# Patient Record
Sex: Female | Born: 1975 | Race: White | Hispanic: No | Marital: Married | Smoking: Never smoker
Health system: Southern US, Community
[De-identification: ages and names within clinical notes are randomized; demographics above are authoritative.]

## PROBLEM LIST (undated history)

## (undated) DIAGNOSIS — N200 Calculus of kidney: Secondary | ICD-10-CM

## (undated) DIAGNOSIS — G4730 Sleep apnea, unspecified: Secondary | ICD-10-CM

## (undated) DIAGNOSIS — E119 Type 2 diabetes mellitus without complications: Secondary | ICD-10-CM

## (undated) DIAGNOSIS — K589 Irritable bowel syndrome without diarrhea: Secondary | ICD-10-CM

## (undated) DIAGNOSIS — I10 Essential (primary) hypertension: Secondary | ICD-10-CM

## (undated) DIAGNOSIS — E785 Hyperlipidemia, unspecified: Secondary | ICD-10-CM

## (undated) DIAGNOSIS — Z8719 Personal history of other diseases of the digestive system: Secondary | ICD-10-CM

## (undated) DIAGNOSIS — R011 Cardiac murmur, unspecified: Secondary | ICD-10-CM

## (undated) HISTORY — DX: Cardiac murmur, unspecified: R01.1

## (undated) HISTORY — PX: SPHINCTEROTOMY: SHX5279

## (undated) HISTORY — PX: COLONOSCOPY: SHX174

## (undated) HISTORY — DX: Hyperlipidemia, unspecified: E78.5

## (undated) HISTORY — DX: Calculus of kidney: N20.0

---

## 2009-07-10 ENCOUNTER — Other Ambulatory Visit: Admission: RE | Admit: 2009-07-10 | Discharge: 2009-07-10 | Payer: Self-pay | Admitting: Family Medicine

## 2010-10-20 ENCOUNTER — Other Ambulatory Visit
Admission: RE | Admit: 2010-10-20 | Discharge: 2010-10-20 | Payer: Self-pay | Source: Home / Self Care | Admitting: Family Medicine

## 2013-05-29 ENCOUNTER — Other Ambulatory Visit (HOSPITAL_COMMUNITY)
Admission: RE | Admit: 2013-05-29 | Discharge: 2013-05-29 | Disposition: A | Discharge status: Home / Self Care | Payer: Managed Care, Other (non HMO) | Source: Ambulatory Visit | Attending: Family Medicine | Admitting: Family Medicine

## 2013-05-29 DIAGNOSIS — Z124 Encounter for screening for malignant neoplasm of cervix: Secondary | ICD-10-CM | POA: Insufficient documentation

## 2013-05-29 DIAGNOSIS — Z1151 Encounter for screening for human papillomavirus (HPV): Secondary | ICD-10-CM | POA: Insufficient documentation

## 2015-01-10 ENCOUNTER — Encounter (HOSPITAL_COMMUNITY): Payer: Self-pay | Admitting: Emergency Medicine

## 2015-01-10 ENCOUNTER — Emergency Department (HOSPITAL_COMMUNITY)
Admission: EM | Admit: 2015-01-10 | Discharge: 2015-01-11 | Discharge status: Against Medical Advice | Payer: 59 | Attending: Emergency Medicine | Admitting: Emergency Medicine

## 2015-01-10 DIAGNOSIS — R112 Nausea with vomiting, unspecified: Secondary | ICD-10-CM | POA: Diagnosis not present

## 2015-01-10 DIAGNOSIS — R197 Diarrhea, unspecified: Secondary | ICD-10-CM | POA: Diagnosis not present

## 2015-01-10 HISTORY — DX: Irritable bowel syndrome, unspecified: K58.9

## 2015-01-10 NOTE — ED Notes (Signed)
Pt reports onset of N/V/D starting this morning. Pt reports hx of IBS but states this does not feel like an IBS flare up. Pt unable to come out of lobby restroom out of fear of going to bathroom on herself.

## 2015-01-11 NOTE — ED Notes (Signed)
No answer in WR

## 2015-01-15 ENCOUNTER — Encounter (HOSPITAL_COMMUNITY): Payer: Self-pay | Admitting: *Deleted

## 2015-01-15 ENCOUNTER — Emergency Department (HOSPITAL_COMMUNITY)
Admission: EM | Admit: 2015-01-15 | Discharge: 2015-01-15 | Disposition: A | Discharge status: Home / Self Care | Payer: 59 | Attending: Emergency Medicine | Admitting: Emergency Medicine

## 2015-01-15 DIAGNOSIS — R112 Nausea with vomiting, unspecified: Secondary | ICD-10-CM

## 2015-01-15 DIAGNOSIS — Z79899 Other long term (current) drug therapy: Secondary | ICD-10-CM | POA: Diagnosis not present

## 2015-01-15 DIAGNOSIS — E119 Type 2 diabetes mellitus without complications: Secondary | ICD-10-CM | POA: Diagnosis not present

## 2015-01-15 DIAGNOSIS — I10 Essential (primary) hypertension: Secondary | ICD-10-CM | POA: Diagnosis not present

## 2015-01-15 DIAGNOSIS — R000 Tachycardia, unspecified: Secondary | ICD-10-CM | POA: Diagnosis not present

## 2015-01-15 DIAGNOSIS — Z3202 Encounter for pregnancy test, result negative: Secondary | ICD-10-CM | POA: Insufficient documentation

## 2015-01-15 DIAGNOSIS — Z880 Allergy status to penicillin: Secondary | ICD-10-CM | POA: Diagnosis not present

## 2015-01-15 DIAGNOSIS — R197 Diarrhea, unspecified: Secondary | ICD-10-CM | POA: Insufficient documentation

## 2015-01-15 DIAGNOSIS — Z7982 Long term (current) use of aspirin: Secondary | ICD-10-CM | POA: Diagnosis not present

## 2015-01-15 DIAGNOSIS — Z8719 Personal history of other diseases of the digestive system: Secondary | ICD-10-CM | POA: Diagnosis not present

## 2015-01-15 HISTORY — DX: Type 2 diabetes mellitus without complications: E11.9

## 2015-01-15 HISTORY — DX: Essential (primary) hypertension: I10

## 2015-01-15 LAB — CBC
HCT: 45.8 % (ref 36.0–46.0)
Hemoglobin: 15.8 g/dL — ABNORMAL HIGH (ref 12.0–15.0)
MCH: 30 pg (ref 26.0–34.0)
MCHC: 34.5 g/dL (ref 30.0–36.0)
MCV: 86.9 fL (ref 78.0–100.0)
Platelets: 363 10*3/uL (ref 150–400)
RBC: 5.27 MIL/uL — AB (ref 3.87–5.11)
RDW: 13.1 % (ref 11.5–15.5)
WBC: 10.1 10*3/uL (ref 4.0–10.5)

## 2015-01-15 LAB — BASIC METABOLIC PANEL
Anion gap: 11 (ref 5–15)
BUN: 13 mg/dL (ref 6–23)
CALCIUM: 8.4 mg/dL (ref 8.4–10.5)
CO2: 22 mmol/L (ref 19–32)
Chloride: 109 mmol/L (ref 96–112)
Creatinine, Ser: 0.64 mg/dL (ref 0.50–1.10)
GFR calc non Af Amer: >90 mL/min (ref >90)
Glucose, Bld: 123 mg/dL — ABNORMAL HIGH (ref 70–99)
Potassium: 3.8 mmol/L (ref 3.5–5.1)
SODIUM: 142 mmol/L (ref 135–145)

## 2015-01-15 LAB — POC URINE PREG, ED: PREG TEST UR: NEGATIVE

## 2015-01-15 LAB — CBG MONITORING, ED: Glucose-Capillary: 143 mg/dL — ABNORMAL HIGH (ref 70–99)

## 2015-01-15 MED ORDER — SODIUM CHLORIDE 0.9 % IV BOLUS (SEPSIS)
1000.0000 mL | Freq: Once | INTRAVENOUS | Status: AC
  Administered 2015-01-15: 1000 mL via INTRAVENOUS

## 2015-01-15 MED ORDER — ONDANSETRON HCL 4 MG/2ML IJ SOLN
4.0000 mg | Freq: Once | INTRAMUSCULAR | Status: AC
  Administered 2015-01-15: 4 mg via INTRAVENOUS
  Filled 2015-01-15: qty 2

## 2015-01-15 MED ORDER — ONDANSETRON HCL 4 MG PO TABS: 4.0000 mg | ORAL_TABLET | Freq: Four times a day (QID) | ORAL | Status: DC

## 2015-01-15 NOTE — ED Notes (Signed)
Patient denies "having any ability or need to use restroom at this time"

## 2015-01-15 NOTE — ED Provider Notes (Signed)
CSN: [id number]     Arrival date & time 01/15/15  1122 History   First MD Initiated Contact with Patient 01/15/15 1132     Chief Complaint  Patient presents with  . Tachycardia  . Emesis  . Diarrhea     (Consider location/radiation/quality/duration/timing/severity/associated sxs/prior Treatment) HPI [name] is a 39 y.o white female with a history of diabetes presents with N/V/D since 7pm last night.  She said she had the same symptoms on Friday when she started a new medication for her diabetes called Victoza.  She said she took Victoza last night and felt the same way but worse with more than 5 episodes of vomiting. Nothing makes it better or worse. She has no pain now.  She denies any dizziness, abdominal pain, chest pain, shortness of breath or swelling in the legs.  Past Medical History  Diagnosis Date  . IBS (irritable bowel syndrome)   . Diabetes   . Hypertension    No past surgical history on file. No family history on file. History  Substance Use Topics  . Smoking status: Never Smoker   . Smokeless tobacco: Not on file  . Alcohol Use: Yes     Comment: rare   OB History    No data available     Review of Systems  Constitutional: Negative for fever and chills.  Respiratory: Negative for cough and shortness of breath.   Gastrointestinal: Negative for abdominal distention.  Genitourinary: Negative for dysuria, frequency, hematuria and difficulty urinating.  All other systems reviewed and are negative.     Allergies  Metformin and related; Penicillins; and Prilosec  Home Medications   Prior to Admission medications   Medication Sig Start Date End Date Taking? Authorizing Provider  aspirin 81 MG tablet Take 81 mg by mouth daily.   Yes Historical Provider, MD  atenolol (TENORMIN) 100 MG tablet Take 50 mg by mouth 2 (two) times daily. 12/19/14  Yes Historical Provider, MD  Calcium-Vitamin D-Vitamin K (CHEWABLE CALCIUM PO) Take 1 tablet by mouth daily.   Yes  Historical Provider, MD  ENSKYCE 0.15-30 MG-MCG tablet Take 1 tablet by mouth daily. 10/16/14  Yes Historical Provider, MD  glipiZIDE (GLUCOTROL) 10 MG tablet Take 1 tablet by mouth 2 (two) times daily. 10/16/14  Yes Historical Provider, MD  hyoscyamine (LEVSIN, ANASPAZ) 0.125 MG tablet Take 1 tablet by mouth as needed. 12/19/14  Yes Historical Provider, MD  lisinopril (PRINIVIL,ZESTRIL) 10 MG tablet Take 1 tablet by mouth daily. 10/31/14  Yes Historical Provider, MD  Multiple Vitamins-Minerals (MULTIVITAMIN ADULT PO) Take 1 tablet by mouth daily.   Yes Historical Provider, MD  ONGLYZA 5 MG TABS tablet Take 1 tablet by mouth at bedtime. 12/19/14  Yes Historical Provider, MD  simvastatin (ZOCOR) 20 MG tablet Take 10 mg by mouth at bedtime. 10/31/14  Yes Historical Provider, MD  venlafaxine XR (EFFEXOR-XR) 150 MG 24 hr capsule Take 1 capsule by mouth daily. 10/31/14  Yes Historical Provider, MD  VICTOZA 18 MG/3ML SOPN Inject 1.2 mLs into the skin daily. 12/27/14  Yes Historical Provider, MD  Wheat Dextrin (BENEFIBER DRINK MIX PO) Take 15 mLs by mouth daily.   Yes Historical Provider, MD  ondansetron (ZOFRAN) 4 MG tablet Take 1 tablet (4 mg total) by mouth every 6 (six) hours. 01/15/15   Weltha Cathy Patel-Mills, PA-C   BP 141/78 mmHg  Pulse 95  Temp(Src) 99 F (37.2 C) (Oral)  Resp 15  SpO2 100% Physical Exam  Constitutional: She is oriented to person,  place, and time. She appears well-developed and well-nourished.  HENT:  Head: Normocephalic and atraumatic.  Mouth/Throat: Uvula is midline, oropharynx is clear and moist and mucous membranes are normal.  Eyes: Conjunctivae are normal.  Neck: Normal range of motion. Neck supple.  Cardiovascular: Regular rhythm and normal heart sounds.  Tachycardia present.   Pulmonary/Chest: Effort normal and breath sounds normal.  Abdominal: Soft.  Non tender.  Musculoskeletal: Normal range of motion.  Neurological: She is alert and oriented to person, place, and  time.  Skin: Skin is warm and dry.  Nursing note and vitals reviewed.   ED Course  Procedures (including critical care time) Labs Review Labs Reviewed  CBC - Abnormal; Notable for the following:    RBC 5.27 (*)    Hemoglobin 15.8 (*)    All other components within normal limits  CBG MONITORING, ED - Abnormal; Notable for the following:    Glucose-Capillary 143 (*)    All other components within normal limits  BASIC METABOLIC PANEL  CBG MONITORING, ED  POC URINE PREG, ED    Imaging Review No results found.   EKG Interpretation   Date/Time:  Wednesday January 15 2015 11:43:50 EDT Ventricular Rate:  115 PR Interval:  141 QRS Duration: 80 QT Interval:  332 QTC Calculation: 459 R Axis:   12 Text Interpretation:  Sinus tachycardia Baseline wander in lead(s) II III  aVR aVF V1 V2 V4 V5 V6 No old tracing to compare Confirmed by GOLDSTON   MD, SCOTT ([id number]) on 01/15/2015 1:51:03 PM      MDM   Final diagnoses:  Nausea vomiting and diarrhea   Patient is here for N/V/D but no abdominal pain. She has a history of IBS.  She has no pain complaints.  She is unable to hold down fluids since yesterday night.  She is tachycardic in the 110's on exam. Afebrile. Her CBG is normal.  I have started her on fluids and zofran.    14:15 Patient is feeling much better and has less nausea.  The nurse has tried to get blood several times with unsuccessful attempts.  I will give her another bag of fluids and zofran.   15:30 I am still waiting on a BMP, she is a difficult blood draw.  Her heart rate has improved and is now in the 90's. CBC is normal.  She is unable to give a urine sample after 2L's of fluids.  I will give her another liter.   She is going to see her primary care provider tomorrow. I advised her not to take the Victoza until she follows up with her PCP tomorrow. She agreed with the plan.  Her CBG is 143.   I have explained the plan of care to and signed out this patient to Will  Dansie, PA-C.     Ottie Glazier, PA-C 01/15/15 1651  Sherwood Gambler, MD 01/16/15 [phone]

## 2015-01-15 NOTE — ED Notes (Signed)
Main lab called for recollect of BMET.

## 2015-01-15 NOTE — ED Notes (Signed)
Unsuccessful attempt at lab collection from main lab.

## 2015-01-15 NOTE — ED Notes (Signed)
PA at bedside.

## 2015-01-15 NOTE — ED Notes (Signed)
Pt able to urinate. Urine dark in color. Pt denies urinary symptoms.

## 2015-01-15 NOTE — ED Notes (Signed)
Main lab phlebotomy at bedside

## 2015-01-15 NOTE — ED Notes (Signed)
Pt unable to urinate at this time. Water provided

## 2015-01-15 NOTE — ED Notes (Signed)
Pt aware of the need for a urine sample. 

## 2015-01-15 NOTE — ED Notes (Signed)
Pt alert,oriented, and ambulatory upon DC. 

## 2015-01-15 NOTE — ED Notes (Signed)
Recollect attempt unsuccessful.  RN notified.  Will make another attempt after pt has received more fluids.

## 2015-01-15 NOTE — ED Notes (Signed)
PA will at bedside

## 2015-01-15 NOTE — ED Provider Notes (Signed)
This patient's care was assumed from [facility], PA-C at shift change. Please see her note for further information. Annette Khan is a 39 y.o white female with a history of diabetes presents with N/V/D since 7pm last night.  Patient feeling much better and is no longer tachycardic. She has tolerated juice, water and crackers in the emergency department now. She reports feeling better and is ready to be discharged. We were awaiting basic metabolic panel and urine pregnancy test prior to discharge. Plan is to discharge with return of labs. At my evaluation the patient reports feeling much better. She is tolerated juice, water and crackers in the emergency department prior to discharge. She is no longer tachycardic. Her basic metabolic panel in unremarkable. She is a negative urine pregnancy test. We'll discharge a prescription for Zofran. I advised the patient to follow-up with their primary care provider this week. She tells me she has an appointment with her primary care provider in 2 days. I advised her to keep this appointment. I advised the patient to return to the emergency department with new or worsening symptoms or new concerns. The patient verbalized understanding and agreement with plan.   Results for orders placed or performed during the hospital encounter of 01/15/15  CBC  (at [facility])  Result Value Ref Range   WBC 10.1 4.0 - 10.5 K/uL   RBC 5.27 (H) 3.87 - 5.11 MIL/uL   Hemoglobin 15.8 (H) 12.0 - 15.0 g/dL   HCT 45.8 36.0 - 46.0 %   MCV 86.9 78.0 - 100.0 fL   MCH 30.0 26.0 - 34.0 pg   MCHC 34.5 30.0 - 36.0 g/dL   RDW 13.1 11.5 - 15.5 %   Platelets 363 150 - 400 K/uL  Basic metabolic panel  Result Value Ref Range   Sodium 142 135 - 145 mmol/L   Potassium 3.8 3.5 - 5.1 mmol/L   Chloride 109 96 - 112 mmol/L   CO2 22 19 - 32 mmol/L   Glucose, Bld 123 (H) 70 - 99 mg/dL   BUN 13 6 - 23 mg/dL   Creatinine, Ser 0.64 0.50 - 1.10 mg/dL   Calcium 8.4 8.4 - 10.5 mg/dL   GFR  calc non Af Amer >90 >90 mL/min   GFR calc Af Amer >90 >90 mL/min   Anion gap 11 5 - 15  CBG monitoring, ED  Result Value Ref Range   Glucose-Capillary 143 (H) 70 - 99 mg/dL  POC Urine Preg, ED (if patient is pre-menopausal female) NOT at [facility]  Result Value Ref Range   Preg Test, Ur NEGATIVE NEGATIVE   No results found.      Waynetta Pean, PA-C 01/15/15 1842  Sherwood Gambler, MD 01/16/15 [phone]

## 2015-01-15 NOTE — Discharge Instructions (Signed)

## 2015-01-15 NOTE — ED Notes (Signed)
Light green tube collected by Acey Lav via [place]

## 2015-01-15 NOTE — ED Notes (Signed)
Pt reports hx of diabetes and IBS. Pcp started pt on victoza med for diabetes 2 weeks ago. Pt had nausea/vomiting/diarrhea since last night. Hx HTN, has not taken BP meds. Denies pain.

## 2015-01-30 ENCOUNTER — Other Ambulatory Visit: Payer: Self-pay | Admitting: Family Medicine

## 2015-01-30 ENCOUNTER — Ambulatory Visit
Admission: RE | Admit: 2015-01-30 | Discharge: 2015-01-30 | Disposition: A | Discharge status: Home / Self Care | Payer: 59 | Source: Ambulatory Visit | Attending: Family Medicine | Admitting: Family Medicine

## 2015-01-30 DIAGNOSIS — M25572 Pain in left ankle and joints of left foot: Secondary | ICD-10-CM

## 2015-10-10 ENCOUNTER — Other Ambulatory Visit: Payer: Self-pay | Admitting: Family Medicine

## 2015-10-10 DIAGNOSIS — N63 Unspecified lump in unspecified breast: Secondary | ICD-10-CM

## 2015-10-21 ENCOUNTER — Ambulatory Visit
Admission: RE | Admit: 2015-10-21 | Discharge: 2015-10-21 | Disposition: A | Discharge status: Home / Self Care | Payer: 59 | Source: Ambulatory Visit | Attending: Family Medicine | Admitting: Family Medicine

## 2015-10-21 DIAGNOSIS — N63 Unspecified lump in unspecified breast: Secondary | ICD-10-CM

## 2016-01-07 IMAGING — CR DG ANKLE COMPLETE 3+V*L*
3 series · 3 of 3 positions shown · non-contrast
Comparison: None.

CLINICAL DATA: Twisted left ankle with pain and swelling laterally

EXAM:
LEFT ANKLE COMPLETE - 3+ VIEW

[view not recorded (1 of 3)]
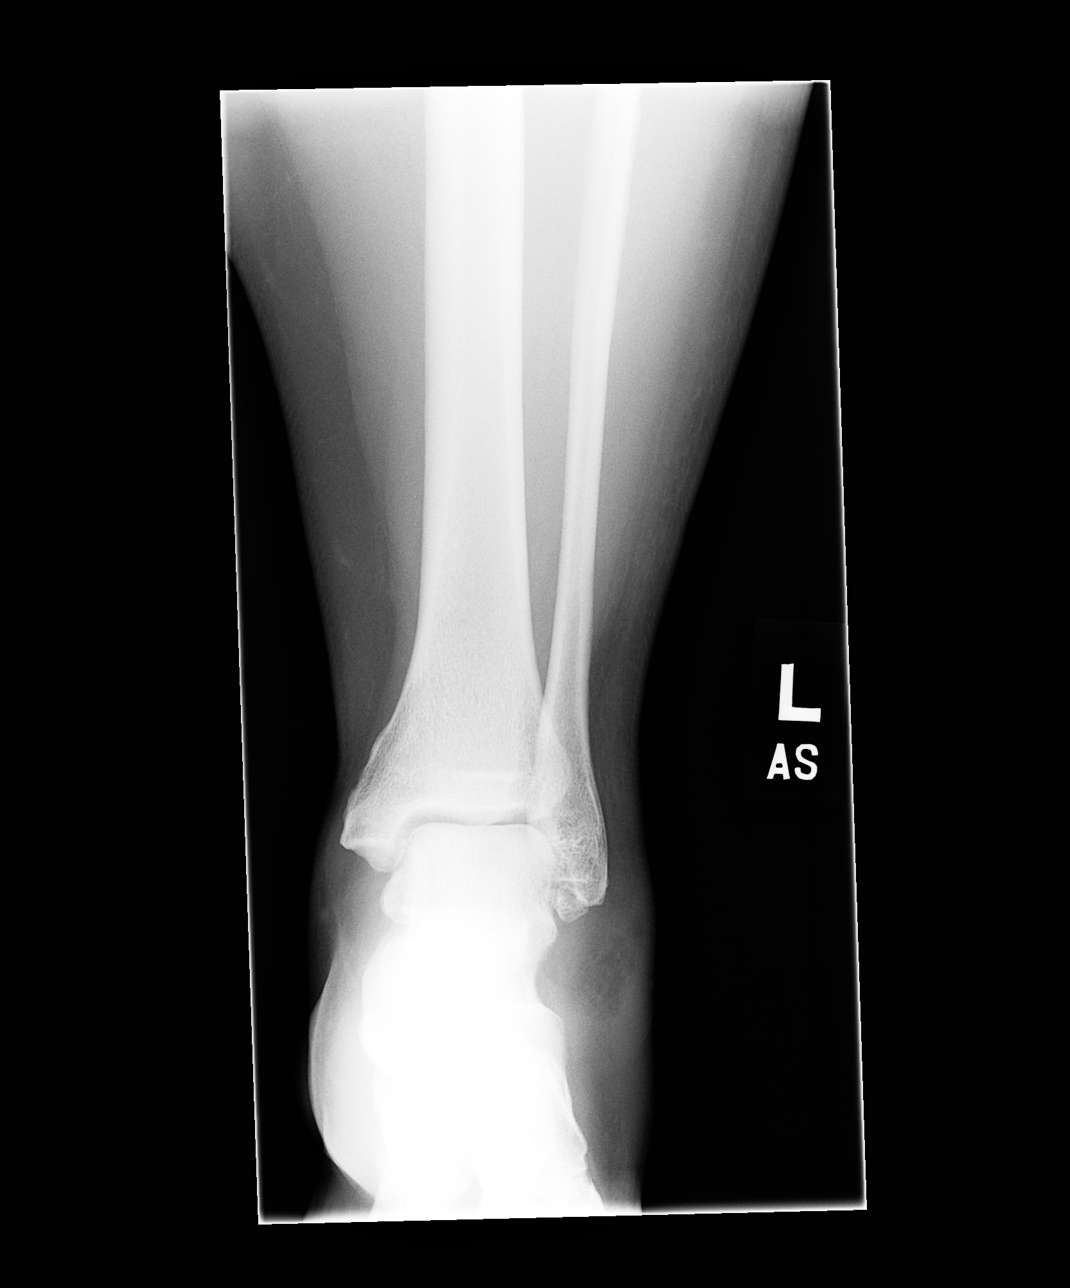

[view not recorded (2 of 3)]
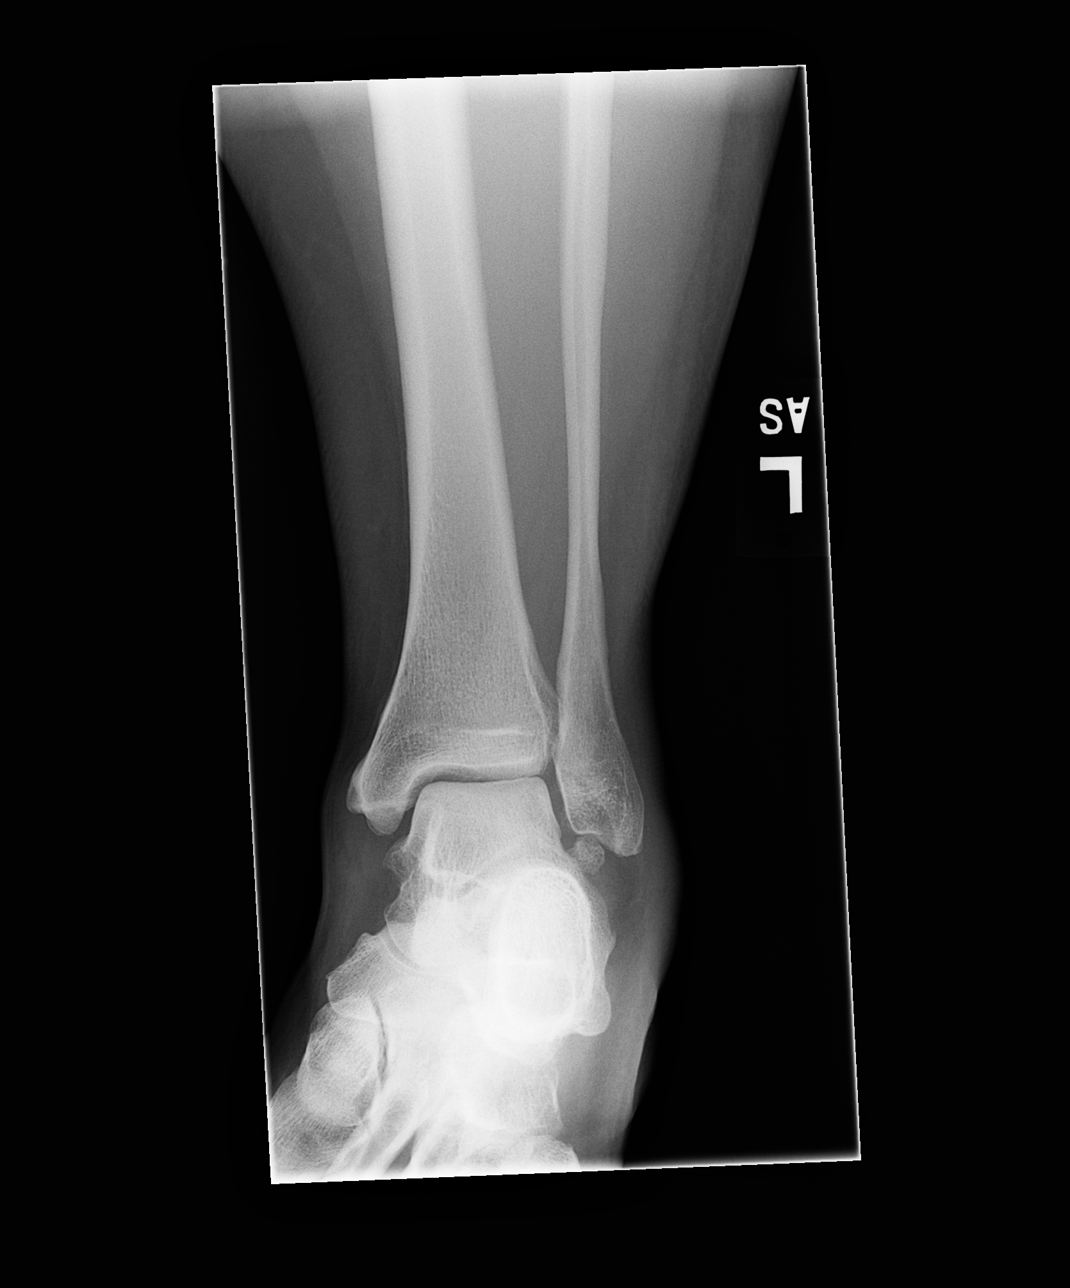

[view not recorded (3 of 3)]
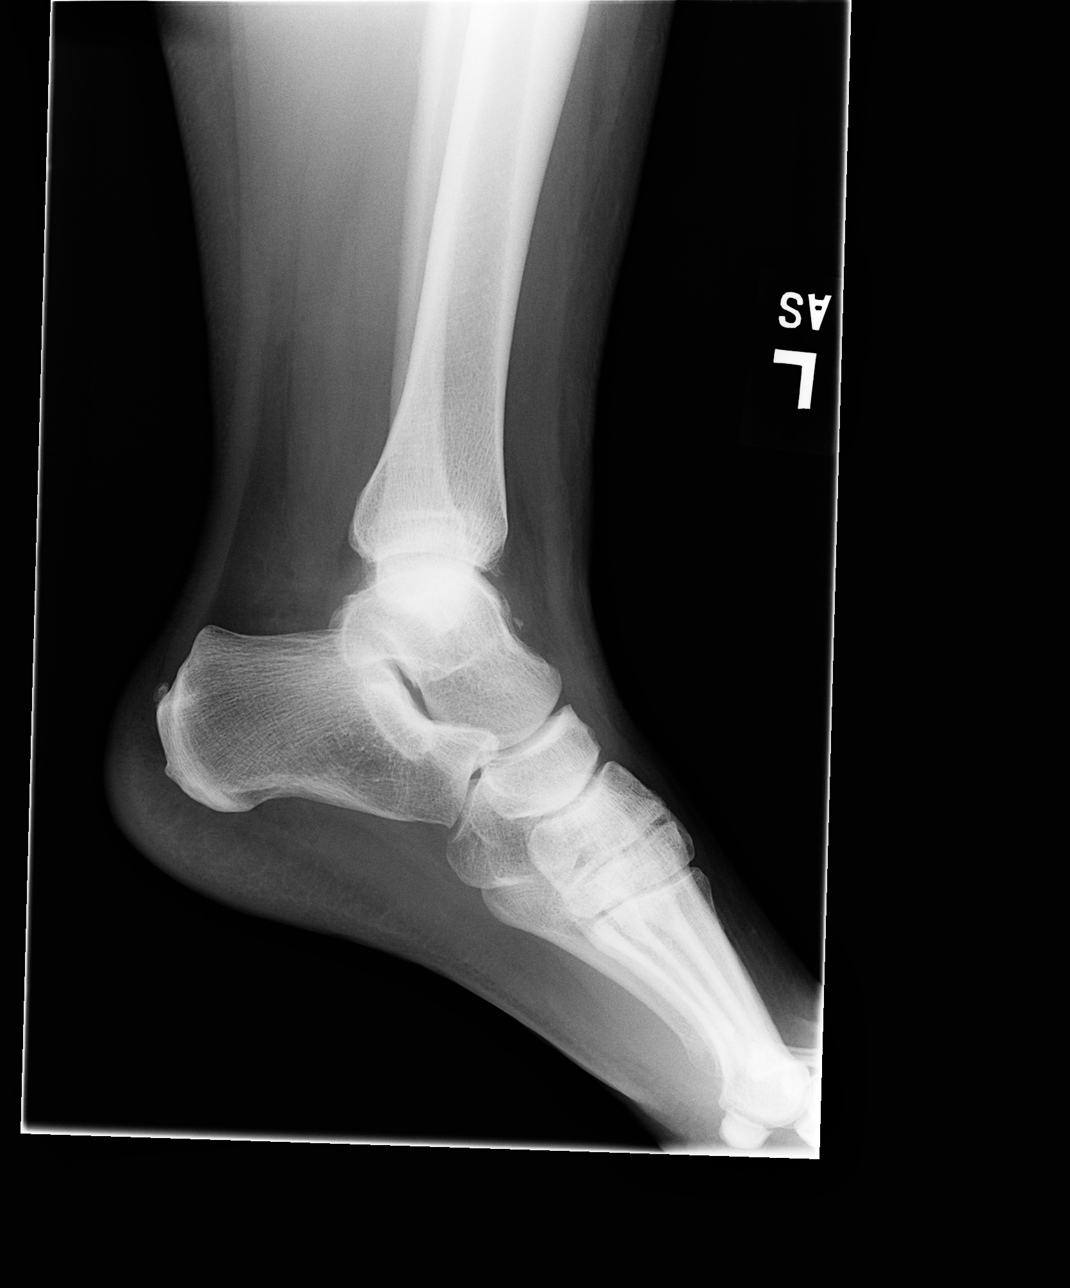

[3 of 3 positions shown; findings below may reference images not displayed]

FINDINGS: No definite acute fracture is seen. There is a bony density just
inferior to the distal left fibula which may be old, being well
corticated. Also on the lateral view there is a small bony density
on the dorsal aspect of the talus which may also be old. The ankle
joint is unremarkable. No definite joint effusion is seen.
IMPRESSION: No definite acute fracture.

## 2016-07-13 ENCOUNTER — Other Ambulatory Visit (HOSPITAL_COMMUNITY)
Admission: RE | Admit: 2016-07-13 | Discharge: 2016-07-13 | Disposition: A | Discharge status: Home / Self Care | Payer: Managed Care, Other (non HMO) | Source: Ambulatory Visit | Attending: Nurse Practitioner | Admitting: Nurse Practitioner

## 2016-07-13 ENCOUNTER — Other Ambulatory Visit: Payer: Self-pay | Admitting: Nurse Practitioner

## 2016-07-13 DIAGNOSIS — Z01419 Encounter for gynecological examination (general) (routine) without abnormal findings: Secondary | ICD-10-CM | POA: Diagnosis present

## 2016-07-13 DIAGNOSIS — Z1151 Encounter for screening for human papillomavirus (HPV): Secondary | ICD-10-CM | POA: Diagnosis not present

## 2016-07-14 LAB — CYTOLOGY - PAP

## 2017-01-21 ENCOUNTER — Other Ambulatory Visit: Payer: Self-pay | Admitting: Family Medicine

## 2017-01-21 DIAGNOSIS — Z1231 Encounter for screening mammogram for malignant neoplasm of breast: Secondary | ICD-10-CM

## 2017-02-10 ENCOUNTER — Encounter: Payer: Self-pay | Admitting: Radiology

## 2017-02-10 ENCOUNTER — Ambulatory Visit
Admission: RE | Admit: 2017-02-10 | Discharge: 2017-02-10 | Disposition: A | Discharge status: Home / Self Care | Payer: Managed Care, Other (non HMO) | Source: Ambulatory Visit | Attending: Family Medicine | Admitting: Family Medicine

## 2017-02-10 DIAGNOSIS — Z1231 Encounter for screening mammogram for malignant neoplasm of breast: Secondary | ICD-10-CM

## 2017-09-12 DIAGNOSIS — H00012 Hordeolum externum right lower eyelid: Secondary | ICD-10-CM | POA: Diagnosis not present

## 2017-11-17 DIAGNOSIS — E1165 Type 2 diabetes mellitus with hyperglycemia: Secondary | ICD-10-CM | POA: Diagnosis not present

## 2017-11-17 DIAGNOSIS — Z794 Long term (current) use of insulin: Secondary | ICD-10-CM | POA: Diagnosis not present

## 2018-01-03 ENCOUNTER — Other Ambulatory Visit: Payer: Self-pay | Admitting: Family Medicine

## 2018-01-03 DIAGNOSIS — Z1231 Encounter for screening mammogram for malignant neoplasm of breast: Secondary | ICD-10-CM

## 2018-01-23 DIAGNOSIS — Z719 Counseling, unspecified: Secondary | ICD-10-CM | POA: Diagnosis not present

## 2018-02-06 DIAGNOSIS — Z719 Counseling, unspecified: Secondary | ICD-10-CM | POA: Diagnosis not present

## 2018-02-13 ENCOUNTER — Ambulatory Visit
Admission: RE | Admit: 2018-02-13 | Discharge: 2018-02-13 | Disposition: A | Discharge status: Home / Self Care | Payer: 59 | Source: Ambulatory Visit | Attending: Family Medicine | Admitting: Family Medicine

## 2018-02-13 DIAGNOSIS — Z719 Counseling, unspecified: Secondary | ICD-10-CM | POA: Diagnosis not present

## 2018-02-13 DIAGNOSIS — Z1231 Encounter for screening mammogram for malignant neoplasm of breast: Secondary | ICD-10-CM

## 2018-02-20 DIAGNOSIS — Z719 Counseling, unspecified: Secondary | ICD-10-CM | POA: Diagnosis not present

## 2018-02-27 DIAGNOSIS — Z719 Counseling, unspecified: Secondary | ICD-10-CM | POA: Diagnosis not present

## 2018-03-06 DIAGNOSIS — Z719 Counseling, unspecified: Secondary | ICD-10-CM | POA: Diagnosis not present

## 2018-03-13 DIAGNOSIS — Z719 Counseling, unspecified: Secondary | ICD-10-CM | POA: Diagnosis not present

## 2018-03-20 DIAGNOSIS — Z719 Counseling, unspecified: Secondary | ICD-10-CM | POA: Diagnosis not present

## 2018-03-29 DIAGNOSIS — Z719 Counseling, unspecified: Secondary | ICD-10-CM | POA: Diagnosis not present

## 2018-04-03 DIAGNOSIS — E119 Type 2 diabetes mellitus without complications: Secondary | ICD-10-CM | POA: Diagnosis not present

## 2018-04-03 DIAGNOSIS — Z719 Counseling, unspecified: Secondary | ICD-10-CM | POA: Diagnosis not present

## 2018-04-10 DIAGNOSIS — Z1159 Encounter for screening for other viral diseases: Secondary | ICD-10-CM | POA: Diagnosis not present

## 2018-04-10 DIAGNOSIS — E119 Type 2 diabetes mellitus without complications: Secondary | ICD-10-CM | POA: Diagnosis not present

## 2018-04-10 DIAGNOSIS — Z719 Counseling, unspecified: Secondary | ICD-10-CM | POA: Diagnosis not present

## 2018-04-17 DIAGNOSIS — Z719 Counseling, unspecified: Secondary | ICD-10-CM | POA: Diagnosis not present

## 2018-04-18 DIAGNOSIS — Z1159 Encounter for screening for other viral diseases: Secondary | ICD-10-CM | POA: Diagnosis not present

## 2018-04-18 DIAGNOSIS — Z23 Encounter for immunization: Secondary | ICD-10-CM | POA: Diagnosis not present

## 2018-04-20 DIAGNOSIS — Z79899 Other long term (current) drug therapy: Secondary | ICD-10-CM | POA: Diagnosis not present

## 2018-04-24 DIAGNOSIS — Z719 Counseling, unspecified: Secondary | ICD-10-CM | POA: Diagnosis not present

## 2020-01-17 ENCOUNTER — Ambulatory Visit: Payer: Self-pay | Attending: Internal Medicine

## 2020-01-17 DIAGNOSIS — Z23 Encounter for immunization: Secondary | ICD-10-CM

## 2020-01-17 NOTE — Progress Notes (Signed)
   Covid-19 Vaccination Clinic  Name:  Brooklan Dusek    MRN: [id number] DOB: 06/08/1976  01/17/2020  Ms. Lobdell was observed post Covid-19 immunization for 15 minutes without incident. She was provided with Vaccine Information Sheet and instruction to access the V-Safe system.   Ms. Blamer was instructed to call 911 with any severe reactions post vaccine: Marland Kitchen Difficulty breathing  . Swelling of face and throat  . A fast heartbeat  . A bad rash all over body  . Dizziness and weakness   Immunizations Administered    Name Date Dose VIS Date Route   Pfizer COVID-19 Vaccine 01/17/2020 11:19 AM 0.3 mL 10/12/2019 Intramuscular   Manufacturer: [place]   Lot: [id number]   [place]: [id number]

## 2020-02-11 ENCOUNTER — Ambulatory Visit: Payer: Self-pay | Attending: Internal Medicine

## 2020-02-11 DIAGNOSIS — Z23 Encounter for immunization: Secondary | ICD-10-CM

## 2020-02-11 NOTE — Progress Notes (Signed)
   Covid-19 Vaccination Clinic  Name:  Cearra Norwick    MRN: [id number] DOB: [birth year]  02/11/2020  Ms. Cong was observed post Covid-19 immunization for 15 minutes without incident. She was provided with Vaccine Information Sheet and instruction to access the V-Safe system.   Ms. Wayner was instructed to call 911 with any severe reactions post vaccine: Marland Kitchen Difficulty breathing  . Swelling of face and throat  . A fast heartbeat  . A bad rash all over body  . Dizziness and weakness   Immunizations Administered    Name Date Dose VIS Date Route   Pfizer COVID-19 Vaccine 02/11/2020 10:06 AM 0.3 mL 10/12/2019 Intramuscular   Manufacturer: [place]   Lot: [id number]   [place]: [id number]

## 2020-12-10 ENCOUNTER — Encounter (HOSPITAL_BASED_OUTPATIENT_CLINIC_OR_DEPARTMENT_OTHER): Payer: Self-pay | Admitting: Obstetrics and Gynecology

## 2020-12-12 ENCOUNTER — Encounter (HOSPITAL_BASED_OUTPATIENT_CLINIC_OR_DEPARTMENT_OTHER)
Admission: RE | Admit: 2020-12-12 | Discharge: 2020-12-12 | Disposition: A | Discharge status: Home / Self Care | Payer: Commercial Managed Care - PPO | Source: Ambulatory Visit | Attending: Obstetrics and Gynecology | Admitting: Obstetrics and Gynecology

## 2020-12-12 DIAGNOSIS — Z01812 Encounter for preprocedural laboratory examination: Secondary | ICD-10-CM | POA: Insufficient documentation

## 2020-12-12 LAB — BASIC METABOLIC PANEL
Anion gap: 10 (ref 5–15)
BUN: 9 mg/dL (ref 6–20)
CO2: 26 mmol/L (ref 22–32)
Calcium: 9.1 mg/dL (ref 8.9–10.3)
Chloride: 100 mmol/L (ref 98–111)
Creatinine, Ser: 0.83 mg/dL (ref 0.44–1.00)
GFR, Estimated: >60 mL/min (ref >60)
Glucose, Bld: 175 mg/dL — ABNORMAL HIGH (ref 70–99)
Potassium: 4.3 mmol/L (ref 3.5–5.1)
Sodium: 136 mmol/L (ref 135–145)

## 2020-12-13 ENCOUNTER — Other Ambulatory Visit (HOSPITAL_COMMUNITY)
Admission: RE | Admit: 2020-12-13 | Discharge: 2020-12-13 | Disposition: A | Discharge status: Home / Self Care | Payer: Commercial Managed Care - PPO | Source: Ambulatory Visit | Attending: Obstetrics and Gynecology | Admitting: Obstetrics and Gynecology

## 2020-12-13 DIAGNOSIS — Z01812 Encounter for preprocedural laboratory examination: Secondary | ICD-10-CM | POA: Insufficient documentation

## 2020-12-13 DIAGNOSIS — Z20822 Contact with and (suspected) exposure to covid-19: Secondary | ICD-10-CM | POA: Diagnosis not present

## 2020-12-13 LAB — SARS CORONAVIRUS 2 (TAT 6-24 HRS): SARS Coronavirus 2: NEGATIVE

## 2020-12-16 ENCOUNTER — Encounter (HOSPITAL_BASED_OUTPATIENT_CLINIC_OR_DEPARTMENT_OTHER)
Admission: RE | Admit: 2020-12-16 | Discharge: 2020-12-16 | Disposition: A | Discharge status: Home / Self Care | Payer: Commercial Managed Care - PPO | Source: Ambulatory Visit | Attending: Obstetrics and Gynecology | Admitting: Obstetrics and Gynecology

## 2020-12-16 ENCOUNTER — Other Ambulatory Visit: Payer: Self-pay

## 2020-12-16 ENCOUNTER — Other Ambulatory Visit: Payer: Self-pay | Admitting: Obstetrics and Gynecology

## 2020-12-16 DIAGNOSIS — Z888 Allergy status to other drugs, medicaments and biological substances status: Secondary | ICD-10-CM | POA: Diagnosis not present

## 2020-12-16 DIAGNOSIS — N920 Excessive and frequent menstruation with regular cycle: Secondary | ICD-10-CM | POA: Diagnosis present

## 2020-12-16 DIAGNOSIS — Z01812 Encounter for preprocedural laboratory examination: Secondary | ICD-10-CM | POA: Diagnosis present

## 2020-12-16 DIAGNOSIS — Z793 Long term (current) use of hormonal contraceptives: Secondary | ICD-10-CM | POA: Diagnosis not present

## 2020-12-16 DIAGNOSIS — Z794 Long term (current) use of insulin: Secondary | ICD-10-CM | POA: Diagnosis not present

## 2020-12-16 DIAGNOSIS — Z302 Encounter for sterilization: Secondary | ICD-10-CM | POA: Diagnosis not present

## 2020-12-16 DIAGNOSIS — Z7982 Long term (current) use of aspirin: Secondary | ICD-10-CM | POA: Diagnosis not present

## 2020-12-16 DIAGNOSIS — Z30432 Encounter for removal of intrauterine contraceptive device: Secondary | ICD-10-CM | POA: Diagnosis not present

## 2020-12-16 DIAGNOSIS — Z880 Allergy status to penicillin: Secondary | ICD-10-CM | POA: Diagnosis not present

## 2020-12-16 DIAGNOSIS — Z79899 Other long term (current) drug therapy: Secondary | ICD-10-CM | POA: Diagnosis not present

## 2020-12-16 LAB — TYPE AND SCREEN
ABO/RH(D): B POS
Antibody Screen: NEGATIVE

## 2020-12-16 LAB — POCT PREGNANCY, URINE: Preg Test, Ur: NEGATIVE

## 2020-12-16 NOTE — Progress Notes (Signed)
Left message for patient to come in for labwork today.

## 2020-12-17 ENCOUNTER — Ambulatory Visit (HOSPITAL_BASED_OUTPATIENT_CLINIC_OR_DEPARTMENT_OTHER): Payer: Commercial Managed Care - PPO | Admitting: Anesthesiology

## 2020-12-17 ENCOUNTER — Ambulatory Visit (HOSPITAL_BASED_OUTPATIENT_CLINIC_OR_DEPARTMENT_OTHER)
Admission: RE | Admit: 2020-12-17 | Discharge: 2020-12-17 | Disposition: A | Discharge status: Home / Self Care | Payer: Commercial Managed Care - PPO | Attending: Obstetrics and Gynecology | Admitting: Obstetrics and Gynecology

## 2020-12-17 ENCOUNTER — Encounter (HOSPITAL_BASED_OUTPATIENT_CLINIC_OR_DEPARTMENT_OTHER)
Admission: RE | Disposition: A | Discharge status: Home / Self Care | Payer: Self-pay | Source: Home / Self Care | Attending: Obstetrics and Gynecology

## 2020-12-17 ENCOUNTER — Encounter (HOSPITAL_BASED_OUTPATIENT_CLINIC_OR_DEPARTMENT_OTHER): Payer: Self-pay | Admitting: Obstetrics and Gynecology

## 2020-12-17 ENCOUNTER — Other Ambulatory Visit: Payer: Self-pay

## 2020-12-17 DIAGNOSIS — Z880 Allergy status to penicillin: Secondary | ICD-10-CM | POA: Insufficient documentation

## 2020-12-17 DIAGNOSIS — Z302 Encounter for sterilization: Secondary | ICD-10-CM | POA: Insufficient documentation

## 2020-12-17 DIAGNOSIS — Z79899 Other long term (current) drug therapy: Secondary | ICD-10-CM | POA: Insufficient documentation

## 2020-12-17 DIAGNOSIS — Z888 Allergy status to other drugs, medicaments and biological substances status: Secondary | ICD-10-CM | POA: Insufficient documentation

## 2020-12-17 DIAGNOSIS — Z7982 Long term (current) use of aspirin: Secondary | ICD-10-CM | POA: Insufficient documentation

## 2020-12-17 DIAGNOSIS — Z30432 Encounter for removal of intrauterine contraceptive device: Secondary | ICD-10-CM | POA: Insufficient documentation

## 2020-12-17 DIAGNOSIS — Z793 Long term (current) use of hormonal contraceptives: Secondary | ICD-10-CM | POA: Insufficient documentation

## 2020-12-17 DIAGNOSIS — N920 Excessive and frequent menstruation with regular cycle: Secondary | ICD-10-CM | POA: Diagnosis not present

## 2020-12-17 DIAGNOSIS — Z794 Long term (current) use of insulin: Secondary | ICD-10-CM | POA: Insufficient documentation

## 2020-12-17 HISTORY — DX: Sleep apnea, unspecified: G47.30

## 2020-12-17 HISTORY — PX: LAPAROSCOPIC TUBAL LIGATION: SHX1937

## 2020-12-17 HISTORY — PX: DILITATION & CURRETTAGE/HYSTROSCOPY WITH HYDROTHERMAL ABLATION: SHX5570

## 2020-12-17 HISTORY — DX: Personal history of other diseases of the digestive system: Z87.19

## 2020-12-17 LAB — GLUCOSE, CAPILLARY
Glucose-Capillary: 170 mg/dL — ABNORMAL HIGH (ref 70–99)
Glucose-Capillary: 271 mg/dL — ABNORMAL HIGH (ref 70–99)

## 2020-12-17 LAB — ABO/RH: ABO/RH(D): B POS

## 2020-12-17 SURGERY — DILATATION & CURETTAGE/HYSTEROSCOPY WITH HYDROTHERMAL ABLATION
Anesthesia: General | Site: Abdomen

## 2020-12-17 MED ORDER — DEXMEDETOMIDINE (PRECEDEX) IN NS 20 MCG/5ML (4 MCG/ML) IV SYRINGE
PREFILLED_SYRINGE | INTRAVENOUS | Status: DC | PRN
  Administered 2020-12-17: 8 ug via INTRAVENOUS
  Administered 2020-12-17: 12 ug via INTRAVENOUS

## 2020-12-17 MED ORDER — SODIUM CHLORIDE 0.9 % IR SOLN
Status: DC | PRN
  Administered 2020-12-17 (×2): 1500 mL

## 2020-12-17 MED ORDER — SILVER NITRATE-POT NITRATE 75-25 % EX MISC
CUTANEOUS | Status: DC | PRN
  Administered 2020-12-17 (×2): 1

## 2020-12-17 MED ORDER — ACETAMINOPHEN 500 MG PO TABS
1000.0000 mg | ORAL_TABLET | ORAL | Status: AC
  Administered 2020-12-17: 1000 mg via ORAL

## 2020-12-17 MED ORDER — CELECOXIB 200 MG PO CAPS
400.0000 mg | ORAL_CAPSULE | ORAL | Status: AC
  Administered 2020-12-17: 400 mg via ORAL

## 2020-12-17 MED ORDER — PROMETHAZINE HCL 25 MG/ML IJ SOLN: 6.2500 mg | INTRAMUSCULAR | Status: DC | PRN

## 2020-12-17 MED ORDER — FENTANYL CITRATE (PF) 100 MCG/2ML IJ SOLN
INTRAMUSCULAR | Status: AC
  Filled 2020-12-17: qty 2

## 2020-12-17 MED ORDER — BUPIVACAINE HCL (PF) 0.25 % IJ SOLN
INTRAMUSCULAR | Status: DC | PRN
  Administered 2020-12-17: 10 mL

## 2020-12-17 MED ORDER — EPHEDRINE 5 MG/ML INJ
INTRAVENOUS | Status: AC
  Filled 2020-12-17: qty 10

## 2020-12-17 MED ORDER — BUPIVACAINE HCL (PF) 0.25 % IJ SOLN
INTRAMUSCULAR | Status: AC
  Filled 2020-12-17: qty 180

## 2020-12-17 MED ORDER — POVIDONE-IODINE 10 % EX SWAB
2.0000 "application " | Freq: Once | CUTANEOUS | Status: AC
  Administered 2020-12-17: 2 via TOPICAL

## 2020-12-17 MED ORDER — MIDAZOLAM HCL 2 MG/2ML IJ SOLN
INTRAMUSCULAR | Status: AC
  Filled 2020-12-17: qty 2

## 2020-12-17 MED ORDER — CLINDAMYCIN PHOSPHATE 900 MG/50ML IV SOLN
900.0000 mg | INTRAVENOUS | Status: AC
  Administered 2020-12-17: 900 mg via INTRAVENOUS

## 2020-12-17 MED ORDER — ONDANSETRON HCL 4 MG/2ML IJ SOLN
INTRAMUSCULAR | Status: AC
  Filled 2020-12-17: qty 2

## 2020-12-17 MED ORDER — LACTATED RINGERS IV SOLN: INTRAVENOUS | Status: DC

## 2020-12-17 MED ORDER — ROCURONIUM BROMIDE 10 MG/ML (PF) SYRINGE
PREFILLED_SYRINGE | INTRAVENOUS | Status: AC
  Filled 2020-12-17: qty 10

## 2020-12-17 MED ORDER — OXYCODONE HCL 5 MG/5ML PO SOLN
5.0000 mg | Freq: Once | ORAL | Status: DC | PRN
Start: 2020-12-17 — End: 2020-12-17

## 2020-12-17 MED ORDER — PROPOFOL 10 MG/ML IV BOLUS
INTRAVENOUS | Status: DC | PRN
  Administered 2020-12-17: 200 mg via INTRAVENOUS

## 2020-12-17 MED ORDER — MEPERIDINE HCL 25 MG/ML IJ SOLN: 6.2500 mg | INTRAMUSCULAR | Status: DC | PRN

## 2020-12-17 MED ORDER — DEXMEDETOMIDINE (PRECEDEX) IN NS 20 MCG/5ML (4 MCG/ML) IV SYRINGE
PREFILLED_SYRINGE | INTRAVENOUS | Status: AC
  Filled 2020-12-17: qty 5

## 2020-12-17 MED ORDER — PHENYLEPHRINE 40 MCG/ML (10ML) SYRINGE FOR IV PUSH (FOR BLOOD PRESSURE SUPPORT)
PREFILLED_SYRINGE | INTRAVENOUS | Status: DC | PRN
  Administered 2020-12-17 (×4): 80 ug via INTRAVENOUS

## 2020-12-17 MED ORDER — ONDANSETRON HCL 4 MG/2ML IJ SOLN
INTRAMUSCULAR | Status: DC | PRN
  Administered 2020-12-17: 4 mg via INTRAVENOUS

## 2020-12-17 MED ORDER — OXYCODONE HCL 5 MG PO TABS: 5.0000 mg | ORAL_TABLET | Freq: Once | ORAL | Status: DC | PRN

## 2020-12-17 MED ORDER — CLINDAMYCIN PHOSPHATE 900 MG/50ML IV SOLN
INTRAVENOUS | Status: AC
  Filled 2020-12-17: qty 50

## 2020-12-17 MED ORDER — SILVER NITRATE-POT NITRATE 75-25 % EX MISC
CUTANEOUS | Status: AC
  Filled 2020-12-17: qty 20

## 2020-12-17 MED ORDER — HYDROMORPHONE HCL 1 MG/ML IJ SOLN: 0.2500 mg | INTRAMUSCULAR | Status: DC | PRN

## 2020-12-17 MED ORDER — CELECOXIB 200 MG PO CAPS
ORAL_CAPSULE | ORAL | Status: AC
  Filled 2020-12-17: qty 2

## 2020-12-17 MED ORDER — SUGAMMADEX SODIUM 200 MG/2ML IV SOLN
INTRAVENOUS | Status: DC | PRN
  Administered 2020-12-17: 200 mg via INTRAVENOUS

## 2020-12-17 MED ORDER — OXYCODONE HCL 5 MG PO TABS: 5.0000 mg | ORAL_TABLET | ORAL | 0 refills | Status: AC | PRN

## 2020-12-17 MED ORDER — DEXAMETHASONE SODIUM PHOSPHATE 4 MG/ML IJ SOLN
INTRAMUSCULAR | Status: DC | PRN
  Administered 2020-12-17: 10 mg via INTRAVENOUS

## 2020-12-17 MED ORDER — LIDOCAINE 2% (20 MG/ML) 5 ML SYRINGE
INTRAMUSCULAR | Status: DC | PRN
  Administered 2020-12-17: 60 mg via INTRAVENOUS

## 2020-12-17 MED ORDER — AMISULPRIDE (ANTIEMETIC) 5 MG/2ML IV SOLN: 10.0000 mg | Freq: Once | INTRAVENOUS | Status: DC | PRN

## 2020-12-17 MED ORDER — FENTANYL CITRATE (PF) 100 MCG/2ML IJ SOLN
INTRAMUSCULAR | Status: DC | PRN
  Administered 2020-12-17: 50 ug via INTRAVENOUS
  Administered 2020-12-17: 100 ug via INTRAVENOUS
  Administered 2020-12-17: 50 ug via INTRAVENOUS

## 2020-12-17 MED ORDER — DEXAMETHASONE SODIUM PHOSPHATE 10 MG/ML IJ SOLN
INTRAMUSCULAR | Status: AC
  Filled 2020-12-17: qty 1

## 2020-12-17 MED ORDER — LIDOCAINE 2% (20 MG/ML) 5 ML SYRINGE
INTRAMUSCULAR | Status: AC
  Filled 2020-12-17: qty 5

## 2020-12-17 MED ORDER — ROCURONIUM BROMIDE 10 MG/ML (PF) SYRINGE
PREFILLED_SYRINGE | INTRAVENOUS | Status: DC | PRN
  Administered 2020-12-17: 20 mg via INTRAVENOUS
  Administered 2020-12-17: 30 mg via INTRAVENOUS
  Administered 2020-12-17: 50 mg via INTRAVENOUS

## 2020-12-17 MED ORDER — PHENYLEPHRINE 40 MCG/ML (10ML) SYRINGE FOR IV PUSH (FOR BLOOD PRESSURE SUPPORT)
PREFILLED_SYRINGE | INTRAVENOUS | Status: AC
  Filled 2020-12-17: qty 10

## 2020-12-17 MED ORDER — IBUPROFEN 800 MG PO TABS: 800.0000 mg | ORAL_TABLET | Freq: Three times a day (TID) | ORAL | 0 refills | Status: AC | PRN

## 2020-12-17 MED ORDER — ACETAMINOPHEN 500 MG PO TABS
ORAL_TABLET | ORAL | Status: AC
  Filled 2020-12-17: qty 2

## 2020-12-17 MED ORDER — MIDAZOLAM HCL 5 MG/5ML IJ SOLN
INTRAMUSCULAR | Status: DC | PRN
  Administered 2020-12-17: 2 mg via INTRAVENOUS

## 2020-12-17 MED ORDER — CIPROFLOXACIN IN D5W 400 MG/200ML IV SOLN
INTRAVENOUS | Status: AC
  Filled 2020-12-17: qty 200

## 2020-12-17 MED ORDER — CIPROFLOXACIN IN D5W 400 MG/200ML IV SOLN
400.0000 mg | INTRAVENOUS | Status: AC
  Administered 2020-12-17: 400 mg via INTRAVENOUS

## 2020-12-17 SURGICAL SUPPLY — 30 items
CATH ROBINSON RED A/P 16FR (CATHETERS) ×4 IMPLANT
DECANTER SPIKE VIAL GLASS SM (MISCELLANEOUS) ×4 IMPLANT
DERMABOND ADVANCED (GAUZE/BANDAGES/DRESSINGS) ×1
DERMABOND ADVANCED .7 DNX12 (GAUZE/BANDAGES/DRESSINGS) ×3 IMPLANT
DILATOR CANAL MILEX (MISCELLANEOUS) IMPLANT
DRSG OPSITE POSTOP 3X4 (GAUZE/BANDAGES/DRESSINGS) ×2 IMPLANT
DURAPREP 26ML APPLICATOR (WOUND CARE) ×4 IMPLANT
ELECT REM PT RETURN 9FT ADLT (ELECTROSURGICAL)
ELECTRODE REM PT RTRN 9FT ADLT (ELECTROSURGICAL) IMPLANT
GAUZE 4X4 16PLY RFD (DISPOSABLE) ×4 IMPLANT
GAUZE SPONGE 4X4 16PLY XRAY LF (GAUZE/BANDAGES/DRESSINGS) ×4 IMPLANT
GLOVE BIOGEL M 6.5 STRL (GLOVE) ×8 IMPLANT
GLOVE SURG UNDER POLY LF SZ6.5 (GLOVE) ×8 IMPLANT
GLOVE SURG UNDER POLY LF SZ7 (GLOVE) ×8 IMPLANT
GOWN STRL REUS W/ TWL LRG LVL3 (GOWN DISPOSABLE) ×6 IMPLANT
GOWN STRL REUS W/TWL LRG LVL3 (GOWN DISPOSABLE) ×10 IMPLANT
PACK LAPAROSCOPY BASIN (CUSTOM PROCEDURE TRAY) ×4 IMPLANT
PACK TRENDGUARD 450 HYBRID PRO (MISCELLANEOUS) IMPLANT
PACK VAGINAL MINOR WOMEN LF (CUSTOM PROCEDURE TRAY) ×4 IMPLANT
PAD OB MATERNITY 4.3X12.25 (PERSONAL CARE ITEMS) ×4 IMPLANT
SET GENESYS HTA PROCERVA (MISCELLANEOUS) ×4 IMPLANT
SLEEVE SCD COMPRESS KNEE MED (MISCELLANEOUS) ×4 IMPLANT
SOLUTION ELECTROLUBE (MISCELLANEOUS) ×4 IMPLANT
SUT VICRYL 0 UR6 27IN ABS (SUTURE) ×4 IMPLANT
SUT VICRYL 4-0 PS2 18IN ABS (SUTURE) ×4 IMPLANT
TOWEL GREEN STERILE FF (TOWEL DISPOSABLE) ×8 IMPLANT
TRENDGUARD 450 HYBRID PRO PACK (MISCELLANEOUS)
TROCAR XCEL NON-BLD 11X100MML (ENDOMECHANICALS) ×4 IMPLANT
UNDERPAD 30X36 HEAVY ABSORB (UNDERPADS AND DIAPERS) ×4 IMPLANT
WARMER LAPAROSCOPE (MISCELLANEOUS) ×4 IMPLANT

## 2020-12-17 NOTE — Discharge Instructions (Signed)
Next dose of Tylenol can be given after 2:00PM. Next dose of NSAID (Ibuprofen, Motrin, Aleve) can be given after 2:00PM.     Post Anesthesia Home Care Instructions  Activity: Get plenty of rest for the remainder of the day. A responsible individual must stay with you for 24 hours following the procedure.  For the next 24 hours, DO NOT: -Drive a car -Paediatric nurse -Drink alcoholic beverages -Take any medication unless instructed by your physician -Make any legal decisions or sign important papers.  Meals: Start with liquid foods such as gelatin or soup. Progress to regular foods as tolerated. Avoid greasy, spicy, heavy foods. If nausea and/or vomiting occur, drink only clear liquids until the nausea and/or vomiting subsides. Call your physician if vomiting continues.  Special Instructions/Symptoms: Your throat may feel dry or sore from the anesthesia or the breathing tube placed in your throat during surgery. If this causes discomfort, gargle with warm salt water. The discomfort should disappear within 24 hours.  If you had a scopolamine patch placed behind your ear for the management of post- operative nausea and/or vomiting:  1. The medication in the patch is effective for 72 hours, after which it should be removed.  Wrap patch in a tissue and discard in the trash. Wash hands thoroughly with soap and water. 2. You may remove the patch earlier than 72 hours if you experience unpleasant side effects which may include dry mouth, dizziness or visual disturbances. 3. Avoid touching the patch. Wash your hands with soap and water after contact with the patch.   Post Anesthesia Home Care Instructions  Activity: Get plenty of rest for the remainder of the day. A responsible individual must stay with you for 24 hours following the procedure.  For the next 24 hours, DO NOT: -Drive a car -Paediatric nurse -Drink alcoholic beverages -Take any medication unless instructed by your  physician -Make any legal decisions or sign important papers.  Meals: Start with liquid foods such as gelatin or soup. Progress to regular foods as tolerated. Avoid greasy, spicy, heavy foods. If nausea and/or vomiting occur, drink only clear liquids until the nausea and/or vomiting subsides. Call your physician if vomiting continues.  Special Instructions/Symptoms: Your throat may feel dry or sore from the anesthesia or the breathing tube placed in your throat during surgery. If this causes discomfort, gargle with warm salt water. The discomfort should disappear within 24 hours.  If you had a scopolamine patch placed behind your ear for the management of post- operative nausea and/or vomiting:  1. The medication in the patch is effective for 72 hours, after which it should be removed.  Wrap patch in a tissue and discard in the trash. Wash hands thoroughly with soap and water. 2. You may remove the patch earlier than 72 hours if you experience unpleasant side effects which may include dry mouth, dizziness or visual disturbances. 3. Avoid touching the patch. Wash your hands with soap and water after contact with the patch.    No Tylenol or Ibuprofen until 2:00 PM.

## 2020-12-17 NOTE — H&P (Signed)
Date of Initial H&P:12/17/2020  History reviewed, patient examined, no change in status, stable for surgery.

## 2020-12-17 NOTE — Anesthesia Postprocedure Evaluation (Signed)
Anesthesia Post Note  Patient: Annette Khan  Procedure(s) Performed: DILATATION & CURETTAGE/HYSTEROSCOPY WITH HYDROTHERMAL ABLATION, removal of IUD (N/A Vagina ) LAPAROSCOPIC BILATERAL TUBAL LIGATION (Bilateral Abdomen)     Patient location during evaluation: PACU Anesthesia Type: General Level of consciousness: awake and alert Pain management: pain level controlled Vital Signs Assessment: post-procedure vital signs reviewed and stable Respiratory status: spontaneous breathing, nonlabored ventilation and respiratory function stable Cardiovascular status: blood pressure returned to baseline and stable Postop Assessment: no apparent nausea or vomiting Anesthetic complications: no   No complications documented.  Last Vitals:  Vitals:   12/17/20 1145 12/17/20 1214  BP: 116/86 (!) 140/94  Pulse: 93 85  Resp: (!) 9 15  Temp:  36.7 C  SpO2: 95% 97%    Last Pain:  Vitals:   12/17/20 1214  TempSrc:   PainSc: 0-No pain                 Lynda Rainwater

## 2020-12-17 NOTE — Anesthesia Preprocedure Evaluation (Signed)
Anesthesia Evaluation  Patient identified by MRN, date of birth, ID band Patient awake    Reviewed: Allergy & Precautions, H&P , NPO status , Patient's Chart, lab work & pertinent test results  Airway Mallampati: II  TM Distance: >3 FB Neck ROM: Full    Dental no notable dental hx.    Pulmonary sleep apnea ,    Pulmonary exam normal breath sounds clear to auscultation       Cardiovascular hypertension, Pt. on medications negative cardio ROS Normal cardiovascular exam Rhythm:Regular Rate:Normal     Neuro/Psych negative neurological ROS  negative psych ROS   GI/Hepatic negative GI ROS, Neg liver ROS,   Endo/Other  negative endocrine ROSdiabetes, Type 2  Renal/GU negative Renal ROS  negative genitourinary   Musculoskeletal negative musculoskeletal ROS (+)   Abdominal (+) + obese,   Peds negative pediatric ROS (+)  Hematology negative hematology ROS (+)   Anesthesia Other Findings   Reproductive/Obstetrics negative OB ROS                             Anesthesia Physical Anesthesia Plan  ASA: III  Anesthesia Plan: General   Post-op Pain Management:    Induction: Intravenous  PONV Risk Score and Plan: 3 and Ondansetron, Dexamethasone, Midazolam and Treatment may vary due to age or medical condition  Airway Management Planned: Oral ETT  Additional Equipment:   Intra-op Plan:   Post-operative Plan: Extubation in OR  Informed Consent: I have reviewed the patients History and Physical, chart, labs and discussed the procedure including the risks, benefits and alternatives for the proposed anesthesia with the patient or authorized representative who has indicated his/her understanding and acceptance.     Dental advisory given  Plan Discussed with: CRNA  Anesthesia Plan Comments:         Anesthesia Quick Evaluation

## 2020-12-17 NOTE — H&P (Signed)
Reason for Appointment   1. 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 Has patient received COVID-19 booster? Yes, [employer]. Does patient report new onset of COVID symptoms? No. Has patient or close contact tested positive for COVID-19? No , not in the past 2 weeks.  General:          45 yo presents for pre-op visit.        Pt is scheduled for laparoscopic bilateral tubal ligation and hysteroscopy/D&C w/ hydrothermal ablation on Dec 17, 2020 secondary to menorrhagia. Pt received clearance for surgery from PCP Dr Jonathon Jordan on Nov 25, 2020.         Pt was seen by NP Delice Bison on Sept 15, 2021 c/o heavy menses llasting for 2 or more weeks which she believed to be due to [facility] placed in Oct 2017. Endorsed worsening PMS sxs of cramps, fatigue, and moodiness. FSH, TSH, CBC WNL. CMP revealed elevated glucose at 378. A1c was 12. Pt was advised to discuss controlling blood glucose levels w/ PCP as this was likely cause of sxs.        U/S on Sept 22, 2021 revealed uterus measuring 7.1 x 2.6 x 3.4 cm. No UT anomalies seen. IUD seen in place. Endometrium was thin at 2.9 mm. RT OV contained simple follicle measuring 1.6 cm and avascular. LT OV WNL.        Pt last seen Aug 04, 2020 c/o menses still lasting for 10-16 days. She reported changing her pad every 2 hrs on her heaviest days. Pt expressed interest in BTL and HTA. Last A1c on Sept 15, 2021 was 12. She was advised that surgery could not be pursued until her A1c levels were controlled under 8.0.        A1c on Sep 17, 2020 was 7.9. A1c on Nov 25, 2020 was 8.1. Dr Jonathon Jordan increased Rx of Trulicity from 1.5 mg to 3 mg on Jan 25th.        Pelvic exam revealed small amt of blood in vaginal vault. IUD strings visualized and 2 cm in length.     Current Medications  Taking  .Vitamin D 25 MCG (1000 UT) Tablet 1 tablet Orally Once a day    .buPROPion  HCl ER (SR) 150 MG Tablet Extended Release 12 Hour 1 tablet in the morning Orally Once a day    .cloNIDine HCl 0.1 MG Tablet 1 tablet Orally twice a day    .Lisinopril 10 MG Tablet 1 tablet Orally Once a day    .Simvastatin 20 MG Tablet 1 tablet in the evening Orally Once a day    .Venlafaxine HCl ER 150 MG Capsule Extended Release 24 Hour TAKE 1 CAPSULE BY MOUTH EVERY DAY WITH FOOD     .Pen Needles 32G X 5 MM Miscellaneous as directed once a day with daily insulin    .Trulicity(Dulaglutide) 3 MG/0.5ML Solution Pen-injector 3 mg Subcutaneous once a week    .One Touch Verio * Glucometer strips For use when checking blood glucose finger stick Up to 3 times daily, alternating AM and PM, before meals    .Aspirin 81 MG Tablet 1 tablet Orally Once a day    .CPAP as directed     .Levsin(Hyoscyamine Sulfate) 0.125 MG Tablet 1 tablet before meals Orally every 4 hrs as needed    .One Touch Verio  Flex Glucometer . Glucometer For use when checking blood sugars Fingerstick As Directed    .Potassium 550 Tablet 1 tablet Orally Once a day    .Womens One Daily Tablet 1 tablet Orally once a day    .ZyrTEC Allergy(Cetirizine HCl) 10 MG Tablet 1 tablet Orally Once a day    .Advil(Ibuprofen) 200 MG Tablet 1 tablet as needed Orally every 6 hrs    .Kyleena(Levonorgestrel) 19.5 MG Intrauterine Device as directed Intrauterine , stop date 12/17/2020    Medication List reviewed and reconciled with the patient         Past Medical History        HTN.         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         Contraception counseling.         Seasonal allergies.         Type 2 diabetes mellitus, diagnosed in Fall 2011, with renal manifestations, Dr. Buddy Duty.         FH of CAD.         Obesity.         CKD-2.         Obstructive sleep apnea (SPLIT 10/26/10, ESS 9, AHI 24/hr, RDI 28/hr, no REM sleep in PSG, O2 nadir 87%; CPAP 11 with AHI 2/hr)--- [facility].          Rosacea.         LAY DOWN FOR BLOOD DRAWS.         Grade 1 Hypertensive retinopathy, heart murmur.         ADD< Dr. Rachel Moulds.        Surgical History         Lateral Sphincterotomy for anal fissure 2002       Family History   Father: deceased 81 yrs, hypertension,MI at 36, diagnosed with Hypertension, Coronary artery disease   Mother: alive 55 yrs, hypertension,hyperlipidemia, diabetes,, diagnosed with Diabetes, Hypertension   Paternal Grand Father: deceased, died in a fire due to a car explosion at a young age   Paternal [place] Mother: deceased   Maternal Grand Father: deceased, MI at 29, diagnosed with Coronary artery disease   Maternal Grand Mother: deceased 68 yrs, diagnosed with CVA   Brother 1: alive 68 yrs, hypertension, diagnosed with Hypertension   1 brother(s) .    Maternal Grandfather died age 54 or 5 of MI. No FH of colon, ovarian, or breast cancer.       Social History  General:   Tobacco use  cigarettes: Never smoked, Tobacco history last updated 12/04/2020, Vaping No. no EXPOSURE TO PASSIVE SMOKE, no. Alcohol: yes, occasionally. Caffeine: yes, coffee 1 cup or per day, 1 diet soda per day. no Recreational drug use. Marital Status: Married. Children: none. 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 OCCUPATION: employed, [employer]. Seat belt use: yes. Miscellaneous: From [place], [place]. no Tobacco Exposure.      Gyn History  Sexual activity currently sexually active, number of sex partners in life, 2.   Periods : irregular.   LMP 12/04/2020.   Birth control [place] IUD.   Last pap smear date 07-16-20.   Last mammogram date 02/13/2018 - normal.   Abnormal pap smear no.   STD none.   Menarche 45.       OB History  Never been pregnant per patient.       Allergies   Penicillin (for allergy): hives - Allergy  metformin: diarrhea-even 250mg  - Side Effects   Omeprazole: extreme nausea - Side Effects    Azelastine HCl: nose bleeds - Side Effects   Victoza: violent vomitting & diarrher - Side Effects   Cold medication: elevates BP - Side Effects       Hospitalization/Major Diagnostic Procedure   Denies Past Hospitalization       Review of Systems  CONSTITUTIONAL:         Chills No. Fatigue YES. Fever No. Night sweats YES. Recent travel outside [place] No. Sweats No. Weight change No.     OPHTHALMOLOGY:         Blurring of vision no. Change in vision no. Double vision no.     ENT:         Dizziness no. Nose bleeds no. Sore throat no. Teeth pain no.     ALLERGY:         Hives no.     CARDIOLOGY:         Chest pain no. High blood pressure no. Irregular heart beat no. Leg edema no. Palpitations no.     RESPIRATORY:         Shortness of breath no. Cough no. Wheezing no.     UROLOGY:         Pain with urination no. Urinary urgency no. Urinary frequency no. Urinary incontinence no. Difficulty urinating No. Blood in urine No.     GASTROENTEROLOGY:         Abdominal pain no. Appetite change no. Bloating/belching no. Blood in stool or on toilet paper no. Change in bowel movements no. Constipation no. Diarrhea no. Difficulty swallowing no. Nausea no.     FEMALE REPRODUCTIVE:         Vulvar pain no. Vulvar rash no. Abnormal vaginal bleeding no. Breast pain no. Nipple discharge no. Pain with intercourse no. Pelvic pain no. Unusual vaginal discharge no. Vaginal itching no.     MUSCULOSKELETAL:         Muscle aches no.     NEUROLOGY:         Headache no. Tingling/numbness no. Weakness no.     PSYCHOLOGY:         Depression no. Anxiety no. Nervousness no. Sleep disturbances no. Suicidal ideation no .     ENDOCRINOLOGY:         Excessive thirst no. Excessive urination no. Hair loss no. Heat or cold intolerance no.     HEMATOLOGY/LYMPH:         Abnormal bleeding no. Easy bruising no. Swollen glands no.     DERMATOLOGY:         New/changing skin lesion no. Rash no. Sores no.     Negative  except as stated in HPI.     Vital Signs  Wt 218.4, Wt change -1.6 lb, Ht 63, BMI 38.68, Pulse sitting 115, BP sitting 110/84.     Examination  General Examination:       CONSTITUTIONAL: alert, oriented, NAD . SKIN: moist, warm. EYES: Conjunctiva clear. LUNGS: good I:E efffort noted, CTA bilat. HEART: RRR. ABDOMEN: soft, non-tender/non-distended, bowel sounds present . FEMALE GENITOURINARY: normal external genitalia, labia - unremarkable, vagina - pink moist mucosa, no lesions or abnormal discharge, small amt of blood in vaginal vault, cervix - no discharge or lesions or CMT, adnexa - no masses or tenderness, uterus - nontender and normal size on palpation, IUD strings visualized and 2 cm in length. PSYCH: affect normal, good eye contact.  Assessments     1. Menorrhagia with regular cycle - N92.0 (Primary)   2. Encounter for sterilization - Z30.2     Treatment   1. Menorrhagia with regular cycle   Notes: Pt is scheduled for hysteroscopy/D&C w/ hydrothermal ablation on Dec 17, 2020 secondary to menorrhagia. Pt received clearance for surgery from PCP Dr Jonathon Jordan on Nov 25, 2020. Pt advised that 93% of women have decreased or no menses for up to 5 years. Discussed risks of hysteroscopy including but not limited to infection, bleeding, possible perforation of the uterus, with the need for further surgery. Discussed risk of blood transfusion and risk of HIV or hep B&C (1 out of 2 million and 1 out of 200,000, respectively) with blood transfusion. Pt is aware of risks and desires blood transfusion if needed. Pt advised to avoid NSAIDs (Aspirin, Aleve, Advil, Ibuprofen, Motrin) from now until surgery given risk of bleeding during surgery. She may take Tylenol for pain management. She is advised to avoid eating or drinking starting midnight prior to surgery. Pt is advised that she may have watery discharge after surgery. Discussed post-surgery avoidance of driving for 48 hrs and avoidance  of lifting weight greater than 10 lbs or intercourse for 2 weeks after procedure. Follow up in 4 weeks for 2 wk post-op visit.      2. Encounter for sterilization   Notes: Pt is scheduled for laparoscopic bilateral tubal ligation on Dec 17, 2020 for sterilization. Pt received clearance for surgery from PCP Dr Jonathon Jordan on Nov 25, 2020. Pt is advised Verdia Kuba IUD will be removed during her surgery. Discussed risks of BTL including but not limited to infection, bleeding, damage to her bowel and surrounding organs with the need for further surgery, and risk of ectopic pregnancy. Discussed risk of blood transfusion and risk of HIV or hep B&C (1 out of 2 million and 1 out of 200,000, respectively) with blood transfusion. Pt is aware of risks and desires blood transfusion if needed. Pt advised to avoid NSAIDs (Aspirin, Aleve, Advil, Ibuprofen, Motrin) from now until surgery given risk of bleeding during surgery. She may take Tylenol for pain management. She is advised to avoid eating or drinking starting midnight prior to surgery. Discussed post-surgery avoidance of driving for 48 hrs and avoidance of lifting weight greater than 10 lbs or intercourse for 2 weeks after procedure. Follow up in 4 weeks for 2 wk post-op visit.

## 2020-12-17 NOTE — Anesthesia Procedure Notes (Signed)
Procedure Name: Intubation Date/Time: 12/17/2020 8:44 AM Performed by: Lieutenant Diego, CRNA Pre-anesthesia Checklist: Patient identified, Emergency Drugs available, Suction available and Patient being monitored Patient Re-evaluated:Patient Re-evaluated prior to induction Oxygen Delivery Method: Circle system utilized Preoxygenation: Pre-oxygenation with 100% oxygen Induction Type: IV induction Ventilation: Mask ventilation without difficulty Laryngoscope Size: Miller and 2 Grade View: Grade I Tube type: Oral Tube size: 7.0 mm Number of attempts: 1 Airway Equipment and Method: Stylet Placement Confirmation: ETT inserted through vocal cords under direct vision,  positive ETCO2 and breath sounds checked- equal and bilateral Secured at: 22 cm Tube secured with: Tape Dental Injury: Teeth and Oropharynx as per pre-operative assessment

## 2020-12-17 NOTE — Op Note (Signed)
12/17/2020  10:54 AM  PATIENT:  Annette Khan  45 y.o. female  PRE-OPERATIVE DIAGNOSIS:  N92.0 Menorrhagia with regular cycle  POST-OPERATIVE DIAGNOSIS:  N92.0 Menorrhagia with regular cycle  PROCEDURE:  Procedure(s): DILATATION & CURETTAGE/HYSTEROSCOPY WITH HYDROTHERMAL ABLATION, removal of IUD (N/A) LAPAROSCOPIC BILATERAL TUBAL LIGATION (Bilateral)  SURGEON:  Surgeon(s) and Role:    Christophe Louis, MD - Primary  PHYSICIAN ASSISTANT: None  ASSISTANTS: none   ANESTHESIA:   general  EBL:  5 mL   BLOOD ADMINISTERED:none  DRAINS: Urinary Catheter (Foley) and none   LOCAL MEDICATIONS USED:  MARCAINE     SPECIMEN:  Source of Specimen:  endometrial curettings   DISPOSITION OF SPECIMEN:  PATHOLOGY  COUNTS:  YES  TOURNIQUET:  * No tourniquets in log *  DICTATION: .Dragon Dictation  PLAN OF CARE: Discharge to home after PACU  PATIENT DISPOSITION:  PACU - hemodynamically stable.   Delay start of Pharmacological VTE agent (>24hrs) due to surgical blood loss or risk of bleeding: not applicable  Findings: normal external genitalia, normal vaginal mucosa, normal appearing cervix. Proliferative endometrium.... normal appearing fallopian tubes and ovaries. Normal appearing uterus. Adhesions of the omentum to the anterior abdominal wall on the right side   Procedure: The patient was taken to the operating room where she was placed under general anesthesia. She is placed in the dorsolithotomy position. She was prepped and draped in the usual sterile fashion. Speculum was placed into the vaginal vault. In the anterior lip of the cervix grasped with a single-tooth tenaculum. A uterine manipulator was placed without difficulty. The single-tooth tenaculum was  Removed. The speculum was removed.  Attention was turned to the umbilicus which was injected with 10 cc of quarter percent Marcaine. A 11 mm incision was made with the scalpel. A 11 mm trocar was placed under direct visualization.  Entry into the peritoneum was visualized. The pneumoperitoneum was achieved with CO2 gas. The pelvis was examined and the patient was noted to have adhesions of the omentum to the anterior abdominal wall Her  fallopian tubes and ovaries appeared normal.  Operative scope was inserted  the right fallopian tube was identified and followed out to the fimbriated end. The kleppinger was used to cauterize Along the ampullary  portion of the right fallopian  tube without difficulty.  This was repeated on the Left fallopian tube. the cauterized section was transected with scissors. Excellent hemostasis was noted. Pneumoperitoneum was released. The umbilical trocar was removed under direct visualization. The fascia was closed with 0 Vicryl. The skin was closed with 4-0 Vicryl. The dermabond placed a over the skin incision.    Attention was turned to the vaginal were  A speculum was placed in the vaginal vault.  The uterine manipulator was removed. The anterior lip of the cervix was grasped with a single tooth tenaculum. The uterus was sounded to 8 cm. The cervix was dilated to 8 mm. The hydrothermal hysteroscope was inserted. The ostia were visualized bilaterally. There was no evidence of perforation. The hydrothermal ablation was performed for 10 minutes.  The hysteroscope was removed.  Endometrial curettings were obtained with a sharp curette .  The single tooth tenaculum was removed.  Excellent hemostasis was noted.   Sponge lap and needle counts were correct x 2.  The patient was awakened from anesthesia and taken to the recovery room in stable condition. Marland Kitchen

## 2020-12-17 NOTE — Transfer of Care (Signed)
Immediate Anesthesia Transfer of Care Note  Patient: Cena Bruhn  Procedure(s) Performed: DILATATION & CURETTAGE/HYSTEROSCOPY WITH HYDROTHERMAL ABLATION, removal of IUD (N/A Vagina ) LAPAROSCOPIC BILATERAL TUBAL LIGATION (Bilateral Abdomen)  Patient Location: PACU  Anesthesia Type:General  Level of Consciousness: awake and alert   Airway & Oxygen Therapy: Patient Spontanous Breathing and Patient connected to face mask oxygen  Post-op Assessment: Report given to RN and Post -op Vital signs reviewed and stable  Post vital signs: Reviewed and stable  Last Vitals:  Vitals Value Taken Time  BP 120/92 12/17/20 1103  Temp    Pulse 92 12/17/20 1104  Resp 19 12/17/20 1104  SpO2 100 % 12/17/20 1104  Vitals shown include unvalidated device data.  Last Pain:  Vitals:   12/17/20 0745  TempSrc: Oral  PainSc: 0-No pain      Patients Stated Pain Goal: 4 (40/08/67 [id number])  Complications: No complications documented.

## 2020-12-18 LAB — SURGICAL PATHOLOGY

## 2020-12-19 ENCOUNTER — Encounter (HOSPITAL_BASED_OUTPATIENT_CLINIC_OR_DEPARTMENT_OTHER): Payer: Self-pay | Admitting: Obstetrics and Gynecology

## 2020-12-20 ENCOUNTER — Other Ambulatory Visit
Admission: RE | Admit: 2020-12-20 | Discharge: 2020-12-20 | Disposition: A | Discharge status: Home / Self Care | Payer: Commercial Managed Care - PPO | Source: Ambulatory Visit | Attending: *Deleted | Admitting: *Deleted

## 2020-12-20 DIAGNOSIS — M25519 Pain in unspecified shoulder: Secondary | ICD-10-CM | POA: Insufficient documentation

## 2020-12-20 LAB — D-DIMER, QUANTITATIVE: D-Dimer, Quant: 0.98 ug/mL-FEU — ABNORMAL HIGH (ref 0.00–0.50)

## 2020-12-21 ENCOUNTER — Emergency Department (HOSPITAL_COMMUNITY): Payer: Commercial Managed Care - PPO

## 2020-12-21 ENCOUNTER — Emergency Department (HOSPITAL_COMMUNITY)
Admission: EM | Admit: 2020-12-21 | Discharge: 2020-12-21 | Disposition: A | Discharge status: Home / Self Care | Payer: Commercial Managed Care - PPO | Attending: Emergency Medicine | Admitting: Emergency Medicine

## 2020-12-21 ENCOUNTER — Other Ambulatory Visit: Payer: Self-pay

## 2020-12-21 ENCOUNTER — Encounter (HOSPITAL_COMMUNITY): Payer: Self-pay

## 2020-12-21 DIAGNOSIS — E119 Type 2 diabetes mellitus without complications: Secondary | ICD-10-CM | POA: Diagnosis not present

## 2020-12-21 DIAGNOSIS — Z79899 Other long term (current) drug therapy: Secondary | ICD-10-CM | POA: Diagnosis not present

## 2020-12-21 DIAGNOSIS — M25511 Pain in right shoulder: Secondary | ICD-10-CM | POA: Diagnosis present

## 2020-12-21 DIAGNOSIS — I10 Essential (primary) hypertension: Secondary | ICD-10-CM | POA: Diagnosis not present

## 2020-12-21 DIAGNOSIS — Z7982 Long term (current) use of aspirin: Secondary | ICD-10-CM | POA: Diagnosis not present

## 2020-12-21 MED ORDER — KETOROLAC TROMETHAMINE 15 MG/ML IJ SOLN
15.0000 mg | Freq: Once | INTRAMUSCULAR | Status: AC
  Administered 2020-12-21: 15 mg via INTRAVENOUS
  Filled 2020-12-21: qty 1

## 2020-12-21 MED ORDER — IOHEXOL 350 MG/ML SOLN
100.0000 mL | Freq: Once | INTRAVENOUS | Status: AC | PRN
  Administered 2020-12-21: 100 mL via INTRAVENOUS

## 2020-12-21 NOTE — Discharge Instructions (Addendum)
Continue your prescribed medications as well as anti-inflammatories for management of your persistent shoulder pain.  Your evaluation in the ED has been reassuring.  We recommend continued follow-up with your primary doctor.  Return for new or concerning symptoms.

## 2020-12-21 NOTE — ED Provider Notes (Signed)
[facility] DEPT Provider Note   CSN: [id number] Arrival date & time: 12/21/20  0005     History Chief Complaint  Patient presents with  . Shoulder Pain    Geralene Afshar is a 45 y.o. female.  Alisan Dokes is a 45 y.o. female with a PMH significant for diabetes and hypertension who presents today for right shoulder pain. Patient reports having a dilation and curettage and tubal ligation on 2/16 and later that evening developed right shoulder pain that she describes as deep, dull and achy with intermittent stabbing, sharp exacerbations with breathing and movement. She denies radiation. Has taken motrin for the pain without relief. Patient went to urgent care today and had a D-dimer performed which was mildly elevated. Provider there informed patient she could have a DVT of the upper extremity. She denies chest pain, shortness of breath. Denies abdominal pain, but endorses gassiness.         Past Medical History:  Diagnosis Date  . Diabetes ([place])   . History of hiatal hernia   . Hypertension   . IBS (irritable bowel syndrome)   . Sleep apnea     There are no problems to display for this patient.   Past Surgical History:  Procedure Laterality Date  . DILITATION & CURRETTAGE/HYSTROSCOPY WITH HYDROTHERMAL ABLATION N/A 12/17/2020   Procedure: DILATATION & CURETTAGE/HYSTEROSCOPY WITH HYDROTHERMAL ABLATION, removal of IUD;  Surgeon: Christophe Louis, MD;  Location: [facility];  Service: Gynecology;  Laterality: N/A;  . LAPAROSCOPIC TUBAL LIGATION Bilateral 12/17/2020   Procedure: LAPAROSCOPIC BILATERAL TUBAL LIGATION;  Surgeon: Christophe Louis, MD;  Location: [facility];  Service: Gynecology;  Laterality: Bilateral;     OB History   No obstetric history on file.     History reviewed. No pertinent family history.  Social History   Tobacco Use  . Smoking status: Never Smoker  . Smokeless tobacco: Never Used  Substance Use  Topics  . Alcohol use: Not Currently    Comment: rare  . Drug use: No    Home Medications Prior to Admission medications   Medication Sig Start Date End Date Taking? Authorizing Provider  aspirin 81 MG tablet Take 81 mg by mouth daily.    [provider]  Biotin 1 MG CAPS Take by mouth.    [provider]  buPROPion (WELLBUTRIN XL) 150 MG 24 hr tablet Take 150 mg by mouth daily.    [provider]  Calcium-Vitamin D-Vitamin K (CHEWABLE CALCIUM PO) Take 1 tablet by mouth daily.    [provider]  Dulaglutide (TRULICITY) 3 WU/9.8JX SOPN Inject into the skin.    [provider]  ENSKYCE 0.15-30 MG-MCG tablet Take 1 tablet by mouth daily. 10/16/14   [provider]  hyoscyamine (LEVSIN, ANASPAZ) 0.125 MG tablet Take 1 tablet by mouth as needed. 12/19/14   [provider]  ibuprofen (ADVIL) 800 MG tablet Take 1 tablet (800 mg total) by mouth every 8 (eight) hours as needed. 12/17/20   Christophe Louis, MD  lisinopril (PRINIVIL,ZESTRIL) 10 MG tablet Take 1 tablet by mouth daily. 10/31/14   [provider]  Multiple Vitamins-Minerals (MULTIVITAMIN ADULT PO) Take 1 tablet by mouth daily.    [provider]  oxyCODONE (OXY IR/ROXICODONE) 5 MG immediate release tablet Take 1 tablet (5 mg total) by mouth every 4 (four) hours as needed for up to 7 days for severe pain. 12/17/20 12/24/20  Christophe Louis, MD  simvastatin (ZOCOR) 20 MG tablet  Take 10 mg by mouth at bedtime. 10/31/14   [provider]  venlafaxine XR (EFFEXOR-XR) 150 MG 24 hr capsule Take 1 capsule by mouth daily. 10/31/14   [provider]  Wheat Dextrin (BENEFIBER DRINK MIX PO) Take 15 mLs by mouth daily.    [provider]    Allergies    Metformin and related, Penicillins, and Prilosec [omeprazole]  Review of Systems   Review of Systems  Ten systems reviewed and are negative for acute change, except as noted in the HPI.    Physical  Exam Updated Vital Signs BP 108/80 (BP Location: Left Arm)   Pulse 99   Temp 98.8 F (37.1 C) (Oral)   Resp 17   Ht 5\' 3"  (1.6 m)   Wt 95.3 kg   LMP 12/04/2020 (Approximate)   SpO2 97%   BMI 37.20 kg/m   Physical Exam Vitals and nursing note reviewed.  Constitutional:      General: She is not in acute distress.    Appearance: She is well-developed and well-nourished. She is not diaphoretic.     Comments: Nontoxic appearing and in NAD  HENT:     Head: Normocephalic and atraumatic.  Eyes:     General: No scleral icterus.    Extraocular Movements: EOM normal.     Conjunctiva/sclera: Conjunctivae normal.  Pulmonary:     Effort: Pulmonary effort is normal. No respiratory distress.     Breath sounds: No wheezing or rales.     Comments: Respirations even and unlabored. Musculoskeletal:        General: Normal range of motion.     Cervical back: Normal range of motion.     Comments: No reproducible TTP to the right shoulder. No crepitus, deformity. Normal ROM of the RUE.  Skin:    General: Skin is warm and dry.     Coloration: Skin is not pale.     Findings: No erythema or rash.  Neurological:     Mental Status: She is alert and oriented to person, place, and time.     Coordination: Coordination normal.  Psychiatric:        Mood and Affect: Mood and affect normal.        Behavior: Behavior normal.     ED Results / Procedures / Treatments   Labs (all labs ordered are listed, but only abnormal results are displayed) Labs Reviewed - No data to display  EKG None  Radiology CT Angio Chest PE W and/or Wo Contrast  Result Date: 12/21/2020 CLINICAL DATA:  PE suspected, positive D-dimer, recent laparoscopic uterine ablation and tubal ligation on Wednesday EXAM: CT ANGIOGRAPHY CHEST WITH CONTRAST TECHNIQUE: Multidetector CT imaging of the chest was performed using the standard protocol during bolus administration of intravenous contrast. Multiplanar CT image reconstructions and  MIPs were obtained to evaluate the vascular anatomy. CONTRAST:  179mL OMNIPAQUE IOHEXOL 350 MG/ML SOLN COMPARISON:  None. FINDINGS: Cardiovascular: Satisfactory opacification the pulmonary arteries to the segmental level. No pulmonary artery filling defects are identified. Central pulmonary arteries are normal caliber. Normal heart size. No pericardial effusion. The aortic root is suboptimally assessed given cardiac pulsation artifact. No acute luminal abnormality of the imaged aorta. No periaortic stranding or hemorrhage. Left vertebral artery arises directly from the aortic arch between the left common carotid and subclavian artery origins. Proximal great vessels otherwise unremarkable. No major venous abnormalities are seen. Mediastinum/Nodes: No mediastinal fluid or gas. Normal thyroid gland and thoracic inlet. No acute abnormality of the trachea or  esophagus. No worrisome mediastinal, hilar or axillary adenopathy. Lungs/Pleura: No consolidation, features of edema, pneumothorax, or effusion. Minimal atelectatic changes. No suspicious pulmonary nodules or masses. Upper Abdomen: No acute abnormalities present in the visualized portions of the upper abdomen. Musculoskeletal: No acute osseous abnormality or suspicious osseous lesion. Multilevel discogenic changes in the spine. Partially calcified central disc protrusion with some mild to moderate canal stenosis T9-10. No other significant canal or foraminal impingement is seen. Soft tissue mineralization of the rotator cuff insertions upon the greater tuberosity, likely reflecting calcific tendinosis. No worrisome chest wall masses or lesions. Review of the MIP images confirms the above findings. IMPRESSION: 1. No evidence of acute pulmonary embolism. 2. No acute intrathoracic process. 3. Features of minimal calcific tendinosis of the right shoulder. 4. Multilevel discogenic changes in the spine with a partially calcified central disc protrusion with some mild to  moderate canal stenosis at T9-10. Electronically Signed   By: Lovena Le M.D.   On: 12/21/2020 03:41    Procedures Procedures   Medications Ordered in ED Medications  ketorolac (TORADOL) 15 MG/ML injection 15 mg (15 mg Intravenous Given 12/21/20 0235)  iohexol (OMNIPAQUE) 350 MG/ML injection 100 mL (100 mLs Intravenous Contrast Given 12/21/20 0327)    ED Course  I have reviewed the triage vital signs and the nursing notes.  Pertinent labs & imaging results that were available during my care of the patient were reviewed by me and considered in my medical decision making (see chart for details).  Clinical Course as of 12/21/20 1751  Nancy Fetter Dec 21, 2020  0425 Pain has significantly improved with the Toradol.  Verbalized results of CTA which patient acknowledges. [KH]    Clinical Course User Index [KH] Beverely Pace   MDM Rules/Calculators/A&P                          45 year old female presents to the emergency department for evaluation of right posterior shoulder pain.  Pain is pleuritic.  Her symptoms began the evening of her laparoscopic tubal ligation.  She had a D-dimer completed at urgent care which returned elevated.  CT scan ordered in the ED which shows no evidence of acute PE or other acute intrathoracic process.  Musculoskeletal etiology not excluded, though pain is not specifically reproducible.  Question whether or not her pain may have been brought on by malpositioning during her surgery.  Discussed with patient that residual abdominal air following laparoscopic procedure may also cause referred pain to areas such as the shoulder.  Patient is well-appearing and has remained hemodynamically stable since arrival.  Clinical improvement following IV Toradol.  Stable for discharge and follow-up with her primary care doctor.  Return precautions discussed and provided. Patient discharged in stable condition with no unaddressed concerns.   Final Clinical Impression(s) / ED  Diagnoses Final diagnoses:  Acute pain of right shoulder    Rx / DC Orders ED Discharge Orders    None       Antonietta Breach, PA-C 12/21/20 0657    Shanon Rosser, MD 12/21/20 [phone]

## 2020-12-21 NOTE — ED Triage Notes (Signed)
Pt came in with c/o R shoulder pain. Pt had laparoscopic uterine ablation and tubal ligation on Wednesday. Pt went to [facility] where they were worried about a DVT or embolus. Pt D-dimer elevated as of 12/20/2020. Pt rates pain 5/10.

## 2021-11-28 IMAGING — CT CT ANGIO CHEST
2 of 6 series · 18 of 36 positions shown · IV contrast (omnipaque)
Comparison: None.

CLINICAL DATA: PE suspected, positive D-dimer, recent laparoscopic
uterine ablation and tubal ligation on [REDACTED]

EXAM:
CT ANGIOGRAPHY CHEST WITH CONTRAST
TECHNIQUE: Multidetector CT imaging of the chest was performed using the
standard protocol during bolus administration of intravenous
contrast. Multiplanar CT image reconstructions and MIPs were
obtained to evaluate the vascular anatomy.
CONTRAST:  100mL OMNIPAQUE IOHEXOL 350 MG/ML SOLN

[Series 5: thins · axial · 0.80mm/px · z∈[-276,-76]mm · 17 of 225 slices shown]
[im 13/225  lung]
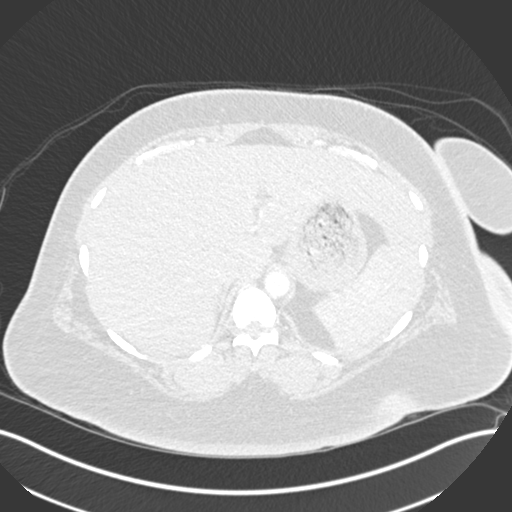
[im 25/225  mediastinal]
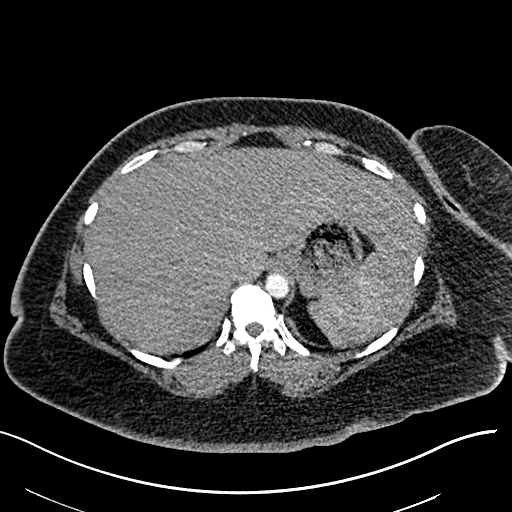
[im 38/225  lung]
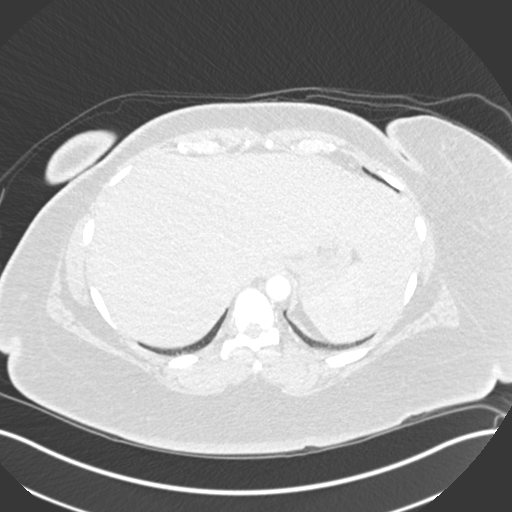
[im 50/225  mediastinal]
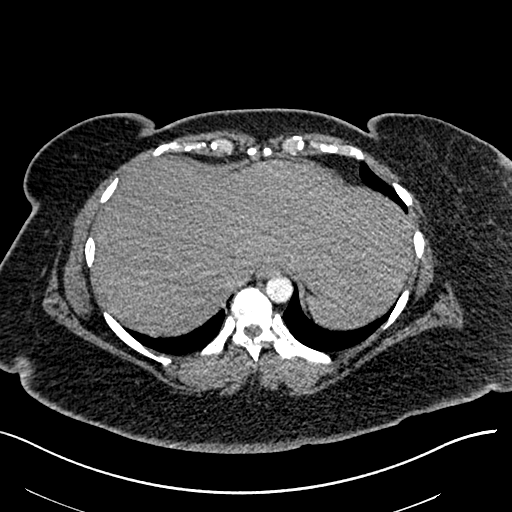
[im 63/225  lung]
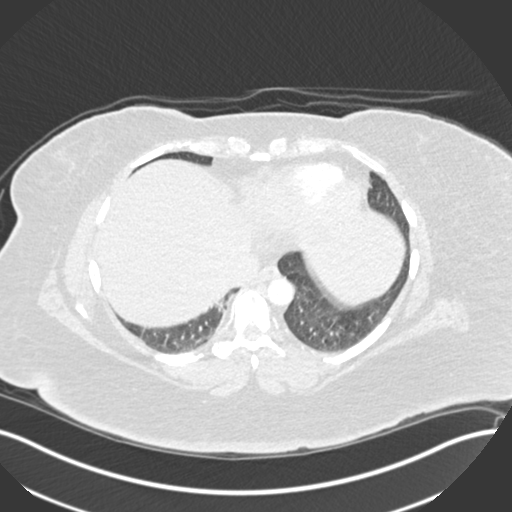
[im 75/225  mediastinal]
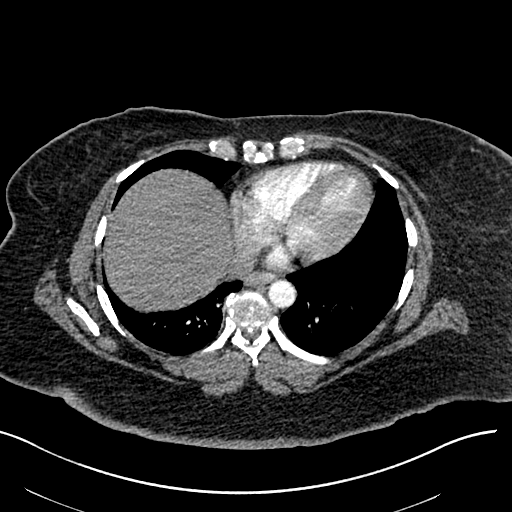
[im 88/225  lung]
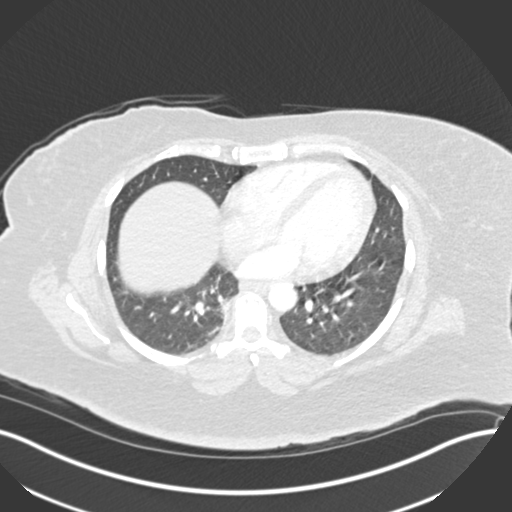
[im 100/225  mediastinal]
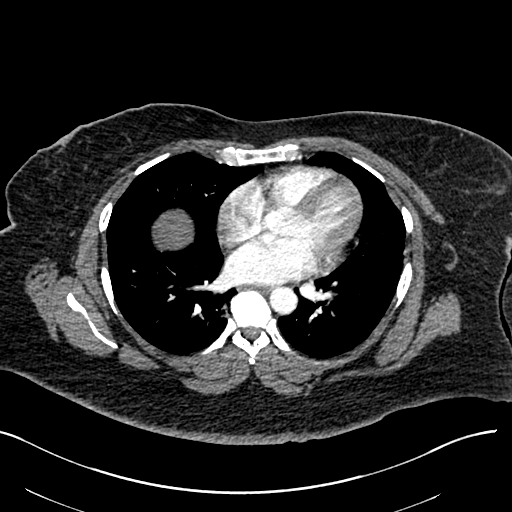
[im 113/225  lung]
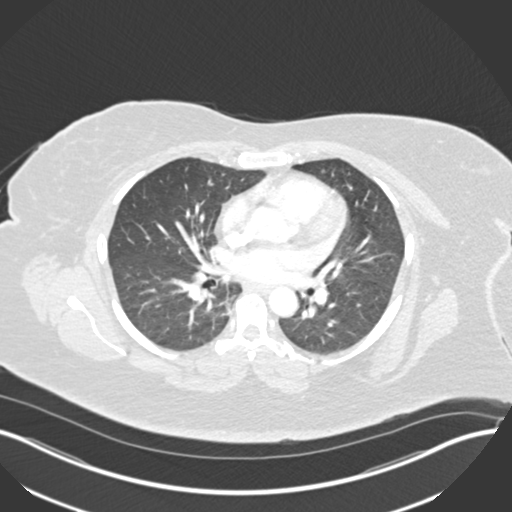
[im 125/225  mediastinal]
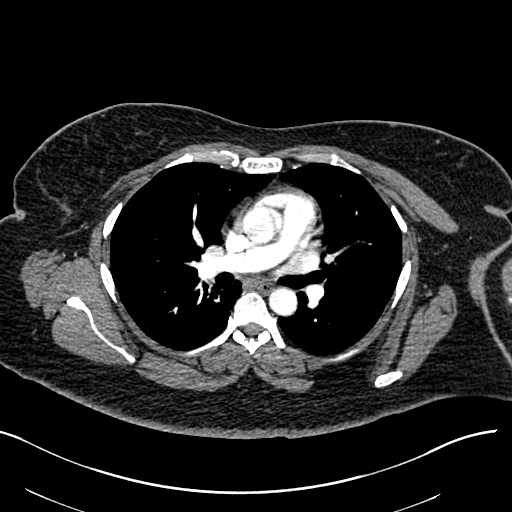
[im 137/225  lung]
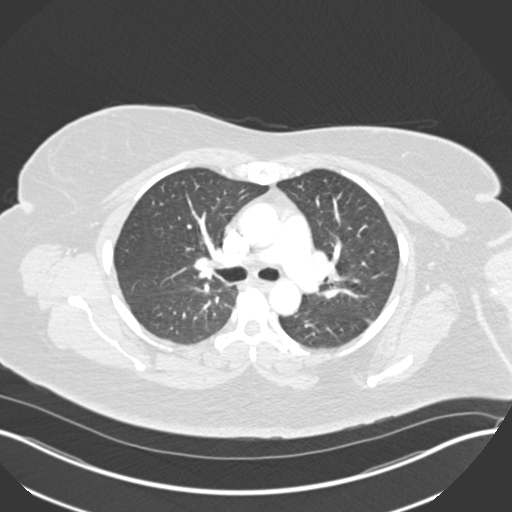
[im 150/225  mediastinal]
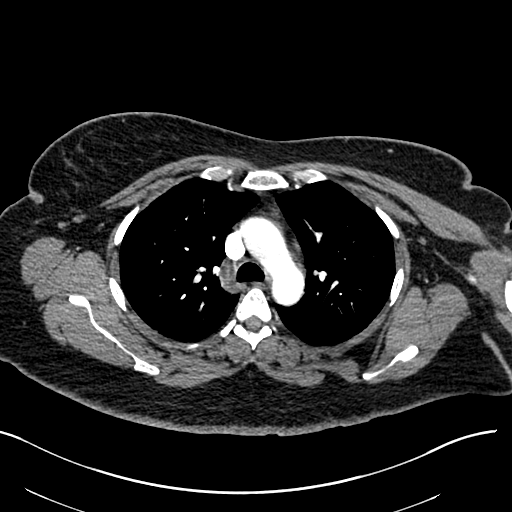
[im 162/225  lung]
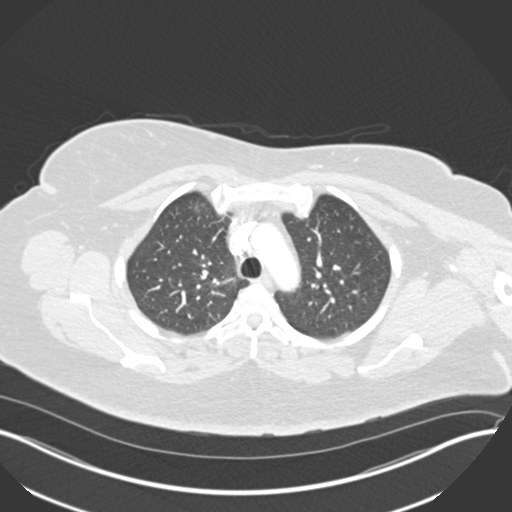
[im 175/225  mediastinal]
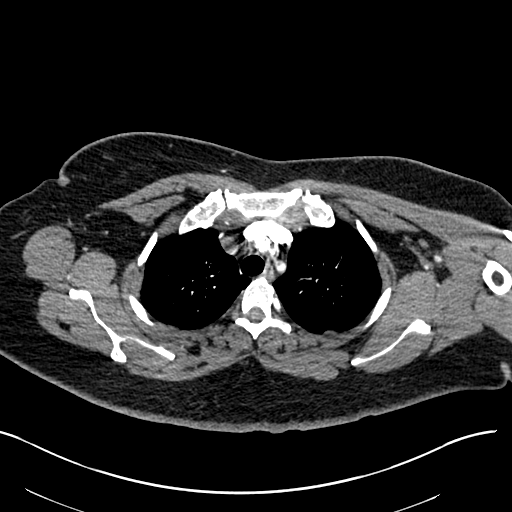
[im 187/225  lung]
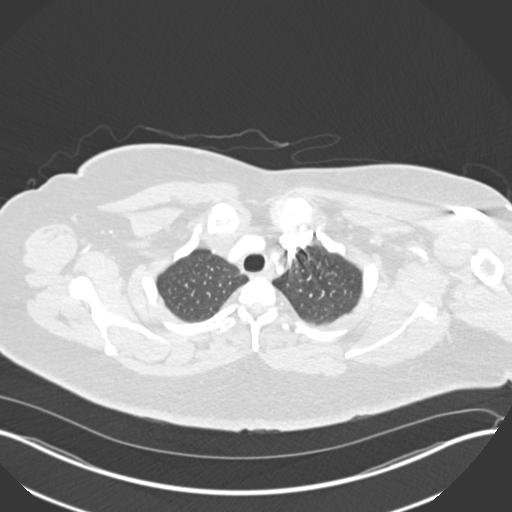
[im 200/225  mediastinal]
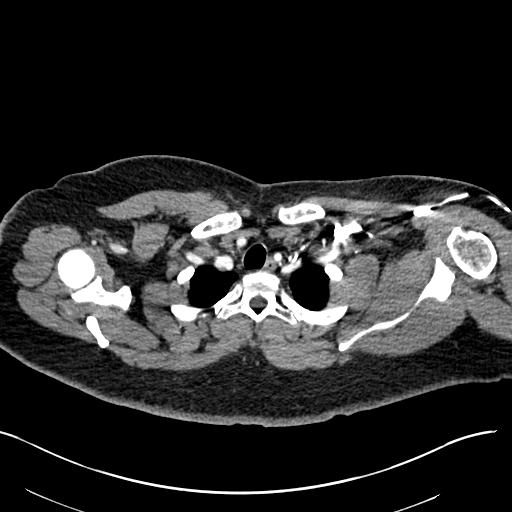
[im 212/225  lung]
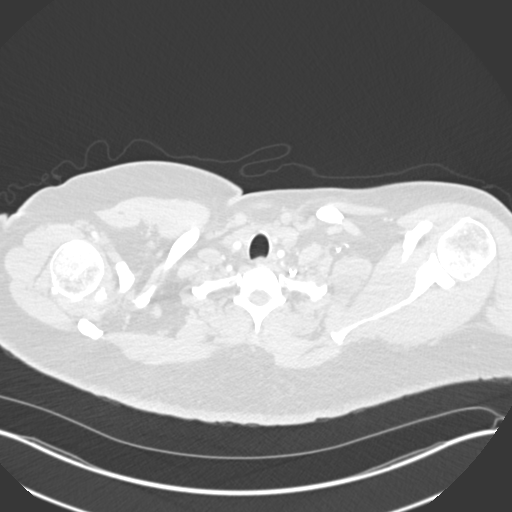

[Series 7: coronal mpr · coronal · 0.48mm/px · 1 of 152 slices shown]
[im 76/152  mediastinal]
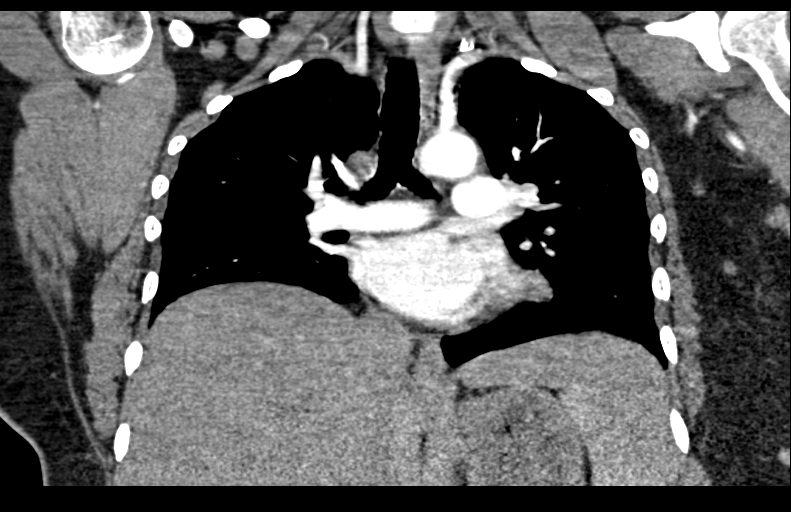

[18 of 36 positions shown; findings below may reference images not displayed]

FINDINGS: Cardiovascular: Satisfactory opacification the pulmonary arteries to
the segmental level. No pulmonary artery filling defects are
identified. Central pulmonary arteries are normal caliber. Normal
heart size. No pericardial effusion. The aortic root is suboptimally
assessed given cardiac pulsation artifact. No acute luminal
abnormality of the imaged aorta. No periaortic stranding or
hemorrhage. Left vertebral artery arises directly from the aortic
arch between the left common carotid and subclavian artery origins.
Proximal great vessels otherwise unremarkable. No major venous
abnormalities are seen.

Mediastinum/Nodes: No mediastinal fluid or gas. Normal thyroid gland
and thoracic inlet. No acute abnormality of the trachea or
esophagus. No worrisome mediastinal, hilar or axillary adenopathy.

Lungs/Pleura: No consolidation, features of edema, pneumothorax, or
effusion. Minimal atelectatic changes. No suspicious pulmonary
nodules or masses.

Upper Abdomen: No acute abnormalities present in the visualized
portions of the upper abdomen.

Musculoskeletal: No acute osseous abnormality or suspicious osseous
lesion. Multilevel discogenic changes in the spine. Partially
calcified central disc protrusion with some mild to moderate canal
stenosis T9-10. No other significant canal or foraminal impingement
is seen. Soft tissue mineralization of the rotator cuff insertions
upon the greater tuberosity, likely reflecting calcific tendinosis.
No worrisome chest wall masses or lesions.

Review of the MIP images confirms the above findings.
IMPRESSION: 1. No evidence of acute pulmonary embolism.
2. No acute intrathoracic process.
3. Features of minimal calcific tendinosis of the right shoulder.
4. Multilevel discogenic changes in the spine with a partially
calcified central disc protrusion with some mild to moderate canal
stenosis at T9-10.

## 2022-07-01 ENCOUNTER — Other Ambulatory Visit: Payer: Self-pay | Admitting: Family Medicine

## 2022-07-01 ENCOUNTER — Ambulatory Visit
Admission: RE | Admit: 2022-07-01 | Discharge: 2022-07-01 | Disposition: A | Discharge status: Home / Self Care | Payer: 59 | Source: Ambulatory Visit | Attending: Family Medicine | Admitting: Family Medicine

## 2022-07-01 DIAGNOSIS — A689 Relapsing fever, unspecified: Secondary | ICD-10-CM

## 2022-08-02 ENCOUNTER — Other Ambulatory Visit: Payer: Self-pay | Admitting: Family Medicine

## 2022-08-02 DIAGNOSIS — N390 Urinary tract infection, site not specified: Secondary | ICD-10-CM

## 2022-08-02 DIAGNOSIS — R509 Fever, unspecified: Secondary | ICD-10-CM

## 2022-08-05 ENCOUNTER — Other Ambulatory Visit: Payer: Self-pay

## 2022-08-05 ENCOUNTER — Ambulatory Visit: Payer: Commercial Managed Care - PPO | Admitting: Internal Medicine

## 2022-08-05 ENCOUNTER — Encounter: Payer: Self-pay | Admitting: Internal Medicine

## 2022-08-05 ENCOUNTER — Ambulatory Visit
Admission: RE | Admit: 2022-08-05 | Discharge: 2022-08-05 | Disposition: A | Discharge status: Home / Self Care | Payer: Commercial Managed Care - PPO | Source: Ambulatory Visit | Attending: Family Medicine | Admitting: Family Medicine

## 2022-08-05 ENCOUNTER — Other Ambulatory Visit: Payer: Self-pay | Admitting: Family Medicine

## 2022-08-05 VITALS — BP 145/98 | HR 92 | Temp 99.0°F | Ht 63.0 in | Wt 212.0 lb

## 2022-08-05 DIAGNOSIS — R509 Fever, unspecified: Secondary | ICD-10-CM

## 2022-08-05 DIAGNOSIS — N390 Urinary tract infection, site not specified: Secondary | ICD-10-CM

## 2022-08-05 DIAGNOSIS — Z1231 Encounter for screening mammogram for malignant neoplasm of breast: Secondary | ICD-10-CM

## 2022-08-05 NOTE — Progress Notes (Signed)
Patient: Annette Khan  DOB: November 08, 1975 MRN: [id number] PCP: Jonathon Jordan, MD   Subjective:  Annette Khan is a 46 y.o. female with diabetes, IBS evaluation of FUO.  She was referred by Dr. Alessandra Grout, Family medicine.  Noted that fevers recurred intermittent for the last 3 months. Her highest temp has been 100.  Associated symptoms include lethargic and fatigue when she has fevers.  She is followed by endocrinology for her DM. Work has been benign except for increased CRP. HIV/RPR/ANA negative.  No other symptoms. Today: Pt had a temp of 101 at 1 pm, temp 99.4 in clinic.  Denies taking tylenol. She reports she has fever aroudn 1-3 pm. Reported fever started around 4 months ago. She she has had "muscle pain" for years, no joint pain.  Denies any burning with urination, abdominal pain, increased frequency. Reports she was started on bactrim  x 7 days for "UIT". Pt states her urine Cx grew ecoli and renal ultrasound ordered Denies any recent medication changes. Pt has hx IBS-D and is on effexor. Last colonoscopy was in 2007. Reports Hx of multiple colonoscopies.  Travel to [place] and [place]. Last trip in May to [place], [place]. 8 years ago went on a cruise to [place]. Hobby: swimming laps a the pool Occupation: Pt does Solicitor, works from home.  Pets: 3 cats and a russian turtle. Pt lives in a house.  Review of Systems  All other systems reviewed and are negative.   Past Medical History:  Diagnosis Date   Diabetes ([place])    History of hiatal hernia    Hypertension    IBS (irritable bowel syndrome)    Sleep apnea     Outpatient Medications Prior to Visit  Medication Sig Dispense Refill   aspirin 81 MG tablet Take 81 mg by mouth daily.     Biotin 1 MG CAPS Take by mouth.     buPROPion (WELLBUTRIN XL) 150 MG 24 hr tablet Take 150 mg by mouth daily.     Calcium-Vitamin D-Vitamin K (CHEWABLE CALCIUM PO) Take 1 tablet by mouth daily.     Dulaglutide (TRULICITY) 3  YK/9.9IP SOPN Inject into the skin.     ENSKYCE 0.15-30 MG-MCG tablet Take 1 tablet by mouth daily.     hyoscyamine (LEVSIN, ANASPAZ) 0.125 MG tablet Take 1 tablet by mouth as needed.     ibuprofen (ADVIL) 800 MG tablet Take 1 tablet (800 mg total) by mouth every 8 (eight) hours as needed. 30 tablet 0   lisinopril (PRINIVIL,ZESTRIL) 10 MG tablet Take 1 tablet by mouth daily.     Multiple Vitamins-Minerals (MULTIVITAMIN ADULT PO) Take 1 tablet by mouth daily.     simvastatin (ZOCOR) 20 MG tablet Take 10 mg by mouth at bedtime.     venlafaxine XR (EFFEXOR-XR) 150 MG 24 hr capsule Take 1 capsule by mouth daily.     Wheat Dextrin (BENEFIBER DRINK MIX PO) Take 15 mLs by mouth daily.     No facility-administered medications prior to visit.     Allergies  Allergen Reactions   Metformin And Related Diarrhea   Penicillins Hives   Prilosec [Omeprazole] Nausea And Vomiting    Social History   Tobacco Use   Smoking status: Never   Smokeless tobacco: Never  Substance Use Topics   Alcohol use: Not Currently    Comment: rare   Drug use: No    No family history on file.  Objective:  There were no vitals filed for  this visit. There is no height or weight on file to calculate BMI.  Physical Exam Constitutional:      Appearance: Normal appearance.  HENT:     Head: Normocephalic and atraumatic.     Right Ear: Tympanic membrane normal.     Left Ear: Tympanic membrane normal.     Nose: Nose normal.     Mouth/Throat:     Mouth: Mucous membranes are moist.  Eyes:     Extraocular Movements: Extraocular movements intact.     Conjunctiva/sclera: Conjunctivae normal.     Pupils: Pupils are equal, round, and reactive to light.  Cardiovascular:     Rate and Rhythm: Normal rate and regular rhythm.     Heart sounds: No murmur heard.    No friction rub. No gallop.  Pulmonary:     Effort: Pulmonary effort is normal.     Breath sounds: Normal breath sounds.  Abdominal:     General: Abdomen is  flat.     Palpations: Abdomen is soft.  Musculoskeletal:        General: Normal range of motion.  Skin:    General: Skin is warm and dry.  Neurological:     General: No focal deficit present.     Mental Status: She is alert and oriented to person, place, and time.  Psychiatric:        Mood and Affect: Mood normal.     Lab Results: Lab Results  Component Value Date   WBC 10.1 01/15/2015   HGB 15.8 (H) 01/15/2015   HCT 45.8 01/15/2015   MCV 86.9 01/15/2015   PLT 363 01/15/2015    Lab Results  Component Value Date   CREATININE 0.83 12/12/2020   BUN 9 12/12/2020   NA 136 12/12/2020   K 4.3 12/12/2020   CL 100 12/12/2020   CO2 26 12/12/2020   No results found for: "ALT", "AST", "GGT", "ALKPHOS", "BILITOT"   Assessment & Plan:  #Concern for FUO #IBS on Effexor #Asymptomatic bacteruria Ecoli + >100,00 colonies, urine Cx on 9/26. temp 97.3 on 9/26. Pt on day 3 of bactrim, renal ultrasound is pending.  -Other w/u on 9/26 included ANA neg, ferretin 2 n. CRP 13 -On 11/04/21: hiv neg, rpr nr -Mammogram scheduled -Pap smear last done was January, no issues reported at [facility] -DM A1C 8.5 9/26,  12.1 on 11/03/21 - 9/8 wbc 9.4, hgb 15.3,plt 358   Plan: -Temp diary x 1 month -Obtain records from [facility] -F/U renal u/s -On simavastatin for years, will get ck -If  labs/work up unrevealing plan on CT AP next visit and  referrral to GI for possible IBS contributing to symptoms -Follow-up with ID in one month  I have personally spent 65 minutes involved in face-to-face and non-face-to-face activities for this patient on the day of the visit. Professional time spent includes the following activities: Preparing to see the patient (review of tests), Obtaining and/or reviewing separately obtained history (admission/discharge record), Performing a medically appropriate examination and/or evaluation , Ordering medications/tests/procedures, referring and communicating with  other health care professionals, Documenting clinical information in the EMR, Independently interpreting results (not separately reported), Communicating results to the patient/family/caregiver, Counseling and educating the patient/family/caregiver and Care coordination (not separately reported).    Laurice Record, MD [facility]   08/05/22  1:54 PM

## 2022-08-05 NOTE — Progress Notes (Signed)
Faxed medical release to [facility]. Will also request records for [facility].  F: [phone] [place], [place]

## 2022-08-06 ENCOUNTER — Ambulatory Visit
Admission: RE | Admit: 2022-08-06 | Discharge: 2022-08-06 | Disposition: A | Discharge status: Home / Self Care | Payer: Commercial Managed Care - PPO | Source: Ambulatory Visit | Attending: Family Medicine | Admitting: Family Medicine

## 2022-08-06 DIAGNOSIS — Z1231 Encounter for screening mammogram for malignant neoplasm of breast: Secondary | ICD-10-CM

## 2022-08-09 LAB — C-REACTIVE PROTEIN: CRP: 13.7 mg/L — ABNORMAL HIGH (ref <8.0)

## 2022-08-09 LAB — QUANTIFERON-TB GOLD PLUS
Mitogen-NIL: >10 IU/mL
NIL: 0.04 IU/mL
QuantiFERON-TB Gold Plus: NEGATIVE
TB1-NIL: 0 IU/mL
TB2-NIL: 0 IU/mL

## 2022-08-09 LAB — SEDIMENTATION RATE: Sed Rate: 19 mm/h (ref 0–20)

## 2022-08-09 LAB — CK: Total CK: 56 U/L (ref 29–143)

## 2022-09-20 ENCOUNTER — Other Ambulatory Visit: Payer: Self-pay

## 2022-09-20 ENCOUNTER — Encounter: Payer: Self-pay | Admitting: Internal Medicine

## 2022-09-20 ENCOUNTER — Ambulatory Visit: Payer: Commercial Managed Care - PPO | Admitting: Internal Medicine

## 2022-09-20 VITALS — BP 144/95 | HR 80 | Temp 97.6°F | Wt 217.0 lb

## 2022-09-20 DIAGNOSIS — K580 Irritable bowel syndrome with diarrhea: Secondary | ICD-10-CM

## 2022-09-20 NOTE — Progress Notes (Unsigned)
Patient's Medications  New Prescriptions   No medications on file  Previous Medications   ASPIRIN 81 MG TABLET    Take 81 mg by mouth daily.   BIOTIN 1 MG CAPS    Take by mouth.   BUPROPION (WELLBUTRIN XL) 150 MG 24 HR TABLET    Take 150 mg by mouth daily.   BUPROPION (WELLBUTRIN XL) 150 MG 24 HR TABLET    Take 1 tablet by mouth daily.   CALCIUM-VITAMIN D-VITAMIN K (CHEWABLE CALCIUM PO)    Take 1 tablet by mouth daily.   CLONIDINE (CATAPRES) 0.1 MG TABLET    Take 0.1 mg by mouth 2 (two) times daily.   DULAGLUTIDE (TRULICITY) 46 YO/3.7CH SOPN    Inject into the skin.   ENSKYCE 0.15-30 MG-MCG TABLET    Take 1 tablet by mouth daily.   HYOSCYAMINE (LEVSIN, ANASPAZ) 0.125 MG TABLET    Take 1 tablet by mouth as needed.   IBUPROFEN (ADVIL) 800 MG TABLET    Take 1 tablet (800 mg total) by mouth every 8 (eight) hours as needed.   LISINOPRIL (PRINIVIL,ZESTRIL) 10 MG TABLET    Take 1 tablet by mouth daily.   MULTIPLE VITAMINS-MINERALS (MULTIVITAMIN ADULT PO)    Take 1 tablet by mouth daily.   SIMVASTATIN (ZOCOR) 20 MG TABLET    Take 10 mg by mouth at bedtime.   TOUJEO SOLOSTAR 300 UNIT/ML SOLOSTAR PEN    Inject into the skin.   VENLAFAXINE XR (EFFEXOR-XR) 150 MG 24 HR CAPSULE    Take 1 capsule by mouth daily.   WHEAT DEXTRIN (BENEFIBER DRINK MIX PO)    Take 15 mLs by mouth daily.  Modified Medications   No medications on file  Discontinued Medications   No medications on file    Subjective:  Annette Khan is a 46 y.o. female with diabetes, IBS evaluation of FUO.  She was referred by Dr. Alessandra Grout, Family medicine.  Noted that fevers recurred intermittent for the last 3 months. Her highest temp has been 100.  Associated symptoms include lethargic and fatigue when she has fevers.  She is followed by endocrinology for her DM. Work has been benign except for increased CRP. HIV/RPR/ANA negative.  No other symptoms. Today: Pt had a temp of 101 at 1 pm, temp 99.4 in clinic.  Denies  taking tylenol. She reports she has fever aroudn 1-3 pm. Reported fever started around 4 months ago. She she has had "muscle pain" for years, no joint pain.  Denies any burning with urination, abdominal pain, increased frequency. Reports she was started on bactrim  x 7 days for "UIT". Pt states her urine Cx grew ecoli and renal ultrasound ordered Denies any recent medication changes. Pt has hx IBS-D and is on effexor. Last colonoscopy was in 2007. Reports Hx of multiple colonoscopies.  Travel to [place] and [place]. Last trip in May to [place], [place]. 8 years ago went on a cruise to [place]. Hobby: swimming laps a the pool Occupation: Pt does Solicitor, works from home.  Pets: 3 cats and a russian turtle. Pt lives in a house. 09/20/22: Since last visit brought in temp recording. She had the flu beginning Nov and temp of 104. Noted that  couple days after period temp would increase around 100. No diarrhea but she has RLQ pain.  Review of Systems: Review of Systems  All other systems reviewed and are negative.   Past Medical History:  Diagnosis Date  Diabetes ([place])    History of hiatal hernia    Hypertension    IBS (irritable bowel syndrome)    Sleep apnea     Social History   Tobacco Use   Smoking status: Never   Smokeless tobacco: Never  Substance Use Topics   Alcohol use: Not Currently    Comment: rare   Drug use: No    No family history on file.  Allergies  Allergen Reactions   Other Hives   Metformin And Related Diarrhea   Penicillins Hives   Prilosec [Omeprazole] Nausea And Vomiting    Health Maintenance  Topic Date Due   HIV Screening  Never done   Hepatitis C Screening  Never done   PAP SMEAR-Modifier  07/14/2019   COVID-19 Vaccine (3 - Pfizer series) 04/07/2020   COLONOSCOPY (Pts 45-45yr Insurance coverage will need to be confirmed)  Never done   INFLUENZA VACCINE  Never done   HPV VACCINES  Aged Out    Objective:  Vitals:   09/20/22 1110   Weight: 217 lb (98.4 kg)   Body mass index is 38.44 kg/m.  Physical Exam Constitutional:      Appearance: Normal appearance.  HENT:     Head: Normocephalic and atraumatic.     Right Ear: Tympanic membrane normal.     Left Ear: Tympanic membrane normal.     Nose: Nose normal.     Mouth/Throat:     Mouth: Mucous membranes are moist.  Eyes:     Extraocular Movements: Extraocular movements intact.     Conjunctiva/sclera: Conjunctivae normal.     Pupils: Pupils are equal, round, and reactive to light.  Cardiovascular:     Rate and Rhythm: Normal rate and regular rhythm.     Heart sounds: No murmur heard.    No friction rub. No gallop.  Pulmonary:     Effort: Pulmonary effort is normal.     Breath sounds: Normal breath sounds.  Abdominal:     General: Abdomen is flat.     Palpations: Abdomen is soft.  Musculoskeletal:        General: Normal range of motion.  Skin:    General: Skin is warm and dry.  Neurological:     General: No focal deficit present.     Mental Status: She is alert and oriented to person, place, and time.  Psychiatric:        Mood and Affect: Mood normal.     Lab Results Lab Results  Component Value Date   WBC 10.1 01/15/2015   HGB 15.8 (H) 01/15/2015   HCT 45.8 01/15/2015   MCV 86.9 01/15/2015   PLT 363 01/15/2015    Lab Results  Component Value Date   CREATININE 0.83 12/12/2020   BUN 9 12/12/2020   NA 136 12/12/2020   K 4.3 12/12/2020   CL 100 12/12/2020   CO2 26 12/12/2020   No results found for: "ALT", "AST", "GGT", "ALKPHOS", "BILITOT"  No results found for: "CHOL", "HDL", "LDLCALC", "LDLDIRECT", "TRIG", "CHOLHDL" No results found for: "LABRPR", "RPRTITER" No results found for: "HIV1RNAQUANT", "HIV1RNAVL", "CD4TABS"   A/P #Concern for FUO #IBS on Effexor #Asymptomatic bacteruria Ecoli + >100,00 colonies, urine Cx on 9/26. temp 97.3 on 9/26. Pt on day 3 of bactrim, renal ultrasound is pending.  -Other w/u on 9/26 included ANA neg,  ferretin 2 n. CRP 13 -On 11/04/21: hiv neg, rpr nr -Mammogram scheduled -[facility] last [facility] was January, [facility] -DM A1C  8.5 9/26,  12.1 on 11/03/21 - 9/8 wbc 9.4, hgb 15.3,plt 358  -F/U renal U/S negative. CK nl. CK 13.7, ESR 19. Plan: -Temp diary showed a trend increase around 100(manual temp) a couple days after her period. Maybe related to menopause/peri-menopause, counseled to f/u with gyn  -Followed by GI in the past, and was due to for colonoscpy on 45. Referral to GI.  -F/U in 3 months with ID  Laurice Record, MD [facility] 09/20/2022, 11:12 AM

## 2022-09-21 ENCOUNTER — Encounter: Payer: Self-pay | Admitting: Gastroenterology

## 2022-11-03 ENCOUNTER — Encounter: Payer: Self-pay | Admitting: Gastroenterology

## 2022-11-03 ENCOUNTER — Ambulatory Visit: Payer: Commercial Managed Care - PPO | Admitting: Gastroenterology

## 2022-11-03 VITALS — BP 120/86 | HR 83 | Ht 63.0 in | Wt 219.2 lb

## 2022-11-03 DIAGNOSIS — K580 Irritable bowel syndrome with diarrhea: Secondary | ICD-10-CM

## 2022-11-03 DIAGNOSIS — Z1211 Encounter for screening for malignant neoplasm of colon: Secondary | ICD-10-CM | POA: Diagnosis not present

## 2022-11-03 MED ORDER — METOCLOPRAMIDE HCL 10 MG PO TABS: ORAL_TABLET | ORAL | 0 refills | Status: DC

## 2022-11-03 MED ORDER — SUTAB 1479-225-188 MG PO TABS
1.0000 | ORAL_TABLET | Freq: Once | ORAL | 0 refills | Status: AC
Start: 2022-11-03 — End: 2022-11-03

## 2022-11-03 NOTE — Patient Instructions (Addendum)
We have sent the following medications to your pharmacy for you to pick up at your convenience: Reglan 10 mg take 1 tablet 30 minutes before drinking colonoscopy prep.   You have been scheduled for a colonoscopy. Please follow written instructions given to you at your visit today.  Please pick up your prep supplies at the pharmacy within the next 1-3 days. If you use inhalers (even only as needed), please bring them with you on the day of your procedure.  _______________________________________________________  If you are age 72 or older, your body mass index should be between 23-30. Your Body mass index is 38.84 kg/m. If this is out of the aforementioned range listed, please consider follow up with your Primary Care Provider.  If you are age 79 or younger, your body mass index should be between 19-25. Your Body mass index is 38.84 kg/m. If this is out of the aformentioned range listed, please consider follow up with your Primary Care Provider.   ________________________________________________________  The [facility] providers would like to encourage you to use [facility] to communicate with providers for non-urgent requests or questions.  Due to long hold times on the telephone, sending your provider a message by [facility] may be a faster and more efficient way to get a response.  Please allow 48 business hours for a response.  Please remember that this is for non-urgent requests.  _______________________________________________________

## 2022-11-03 NOTE — Progress Notes (Signed)
11/03/2022 Annette Khan [id number] 31-Dec-1975   HISTORY OF PRESENT ILLNESS: This is a 47 year old female who is new to our office.  Has been referred here by Dr. Candiss Norse from infectious disease for diagnosis of IBS-D.  The patient tells me she was diagnosed with IBS-D in [place] 24 years ago.  She has had 5 colonoscopies, the last about 12 years ago in [place], all that have been normal except for hemorrhoids.  She says that at the worst she was having 8-20 bowel movements a day.  She had tried Elavil in the past, but started on Effexor around 20 or more years ago.  Symptoms have been overall much better controlled since starting that medication.  Also uses Levsin as needed.  She says that the last several years had been going great.  More recently in February of last year (2023) she started developing low-grade fevers no higher than 100.4.  She says that they have been occurring about once a month though anywhere from 1 to 4 weeks.  She saw infectious disease and they have not been able to come up with anything.  She says that the fever seems to resolve with the start of her period.  Says that she is also going to see OB/GYN.  She sees occasional bright red blood with bowel movements, but says that she has seen that intermittently for years and knows that she has hemorrhoids.  Past Medical History:  Diagnosis Date   Diabetes ([place])    History of hiatal hernia    Hypertension    IBS (irritable bowel syndrome)    Sleep apnea    Past Surgical History:  Procedure Laterality Date   DILITATION & CURRETTAGE/HYSTROSCOPY WITH HYDROTHERMAL ABLATION N/A 12/17/2020   Procedure: DILATATION & CURETTAGE/HYSTEROSCOPY WITH HYDROTHERMAL ABLATION, removal of IUD;  Surgeon: Christophe Louis, MD;  Location: [facility];  Service: Gynecology;  Laterality: N/A;   LAPAROSCOPIC TUBAL LIGATION Bilateral 12/17/2020   Procedure: LAPAROSCOPIC BILATERAL TUBAL LIGATION;  Surgeon: Christophe Louis, MD;  Location: [facility];  Service: Gynecology;  Laterality: Bilateral;    reports that she has never smoked. She has never used smokeless tobacco. She reports that she does not currently use alcohol. She reports that she does not use drugs. family history includes Diabetes in her father and mother; Diverticulitis in her father and mother; Heart attack in her father and maternal grandfather; Irritable bowel syndrome in her father and mother. Allergies  Allergen Reactions   Other Hives   Metformin And Related Diarrhea   Penicillins Hives   Prilosec [Omeprazole] Nausea And Vomiting      Outpatient Encounter Medications as of 11/03/2022  Medication Sig   aspirin 81 MG tablet Take 81 mg by mouth daily.   Biotin 1 MG CAPS Take by mouth.   buPROPion (WELLBUTRIN XL) 150 MG 24 hr tablet Take 150 mg by mouth daily.   buPROPion (WELLBUTRIN XL) 150 MG 24 hr tablet Take 1 tablet by mouth daily.   Calcium-Vitamin D-Vitamin K (CHEWABLE CALCIUM PO) Take 1 tablet by mouth daily.   cloNIDine (CATAPRES) 0.1 MG tablet Take 0.1 mg by mouth 2 (two) times daily.   Dulaglutide (TRULICITY) 3 EU/2.3NT SOPN Inject into the skin.   ENSKYCE 0.15-30 MG-MCG tablet Take 1 tablet by mouth daily.   hyoscyamine (LEVSIN, ANASPAZ) 0.125 MG tablet Take 1 tablet by mouth as needed.   ibuprofen (ADVIL) 800 MG tablet Take 1 tablet (800 mg total) by mouth every 8 (eight)  hours as needed.   lisinopril (PRINIVIL,ZESTRIL) 10 MG tablet Take 1 tablet by mouth daily.   Multiple Vitamins-Minerals (MULTIVITAMIN ADULT PO) Take 1 tablet by mouth daily.   simvastatin (ZOCOR) 20 MG tablet Take 10 mg by mouth at bedtime.   TOUJEO SOLOSTAR 300 UNIT/ML Solostar Pen Inject into the skin.   venlafaxine XR (EFFEXOR-XR) 150 MG 24 hr capsule Take 1 capsule by mouth daily.   Wheat Dextrin (BENEFIBER DRINK MIX PO) Take 15 mLs by mouth daily.   No facility-administered encounter medications on file as of 11/03/2022.     REVIEW OF SYSTEMS  : All other  systems reviewed and negative except where noted in the History of Present Illness.   PHYSICAL EXAM: BP 120/86   Pulse 83   Ht '5\' 3"'$  (1.6 m)   Wt 219 lb 4 oz (99.5 kg)   BMI 38.84 kg/m  General: Well developed white female in no acute distress Head: Normocephalic and atraumatic Eyes:  Sclerae anicteric, conjunctiva pink. Ears: Normal auditory acuity Lungs: Clear throughout to auscultation; no W/R/R. Heart: Regular rate and rhythm; no M/R/G. Abdomen: Soft, non-distended.  BS present.  Non-tender. Rectal:  Will be done at the time of colonoscopy. Musculoskeletal: Symmetrical with no gross deformities  Skin: No lesions on visible extremities Extremities: No edema  Neurological: Alert oriented x 4, grossly non-focal Psychological:  Alert and cooperative. Normal mood and affect  ASSESSMENT AND PLAN: *CRC screening:  Last colonoscopy reportedly 12 years ago. *Long-standing diagnosis of IBS-D:  On Effexor for around 20 years and takes Levsin prn. *Intermittent FUO:  Has seen ID.  Gets temps up to 100.4 degrees every 1-4 weeks (typically once a month).  **Will schedule colonoscopy with Dr. Loletha Carrow.  The risks, benefits, and alternatives to colonoscopy were discussed with the patient and she consents to proceed.   CC:  Laurice Record, MD

## 2022-11-04 NOTE — Progress Notes (Signed)
____________________________________________________________  Attending physician addendum:  Thank you for sending this case to me. I have reviewed the entire note and agree with the plan.   Danille Oppedisano Danis, MD  ____________________________________________________________  

## 2022-11-17 ENCOUNTER — Encounter (HOSPITAL_BASED_OUTPATIENT_CLINIC_OR_DEPARTMENT_OTHER): Payer: Self-pay

## 2022-11-17 ENCOUNTER — Other Ambulatory Visit (HOSPITAL_BASED_OUTPATIENT_CLINIC_OR_DEPARTMENT_OTHER): Payer: Self-pay

## 2022-11-17 MED ORDER — LISDEXAMFETAMINE DIMESYLATE 30 MG PO CAPS
30.0000 mg | ORAL_CAPSULE | Freq: Every day | ORAL | 0 refills | Status: DC
  Filled 2022-11-17: qty 30, 30d supply, fill #0

## 2022-11-18 ENCOUNTER — Encounter: Payer: Self-pay | Admitting: Internal Medicine

## 2022-11-28 ENCOUNTER — Other Ambulatory Visit (HOSPITAL_BASED_OUTPATIENT_CLINIC_OR_DEPARTMENT_OTHER): Payer: Self-pay

## 2022-11-29 ENCOUNTER — Other Ambulatory Visit (HOSPITAL_BASED_OUTPATIENT_CLINIC_OR_DEPARTMENT_OTHER): Payer: Self-pay

## 2022-11-30 ENCOUNTER — Ambulatory Visit: Payer: Commercial Managed Care - PPO | Admitting: Internal Medicine

## 2022-11-30 ENCOUNTER — Encounter: Payer: Self-pay | Admitting: Internal Medicine

## 2022-11-30 ENCOUNTER — Other Ambulatory Visit: Payer: Self-pay

## 2022-11-30 VITALS — BP 122/85 | HR 97 | Temp 98.0°F | Resp 16 | Ht 63.0 in | Wt 220.4 lb

## 2022-11-30 DIAGNOSIS — R509 Fever, unspecified: Secondary | ICD-10-CM | POA: Diagnosis not present

## 2022-11-30 NOTE — Progress Notes (Signed)
Patient Active Problem List   Diagnosis Date Noted   Screening for colon cancer 11/03/2022   Irritable bowel syndrome with diarrhea 11/03/2022    Patient's Medications  New Prescriptions   No medications on file  Previous Medications   ASPIRIN 81 MG TABLET    Take 81 mg by mouth daily.   BIOTIN 1 MG CAPS    Take by mouth.   BUPROPION (WELLBUTRIN XL) 150 MG 24 HR TABLET    Take 150 mg by mouth daily.   BUPROPION (WELLBUTRIN XL) 150 MG 24 HR TABLET    Take 1 tablet by mouth daily.   CALCIUM-VITAMIN D-VITAMIN K (CHEWABLE CALCIUM PO)    Take 1 tablet by mouth daily.   CLONIDINE (CATAPRES) 0.1 MG TABLET    Take 0.1 mg by mouth 2 (two) times daily.   DULAGLUTIDE (TRULICITY) 3 EV/0.3JK SOPN    Inject into the skin.   ENSKYCE 0.15-30 MG-MCG TABLET    Take 1 tablet by mouth daily.   HYOSCYAMINE (LEVSIN, ANASPAZ) 0.125 MG TABLET    Take 1 tablet by mouth as needed.   IBUPROFEN (ADVIL) 800 MG TABLET    Take 1 tablet (800 mg total) by mouth every 8 (eight) hours as needed.   LISINOPRIL (PRINIVIL,ZESTRIL) 10 MG TABLET    Take 1 tablet by mouth daily.   METOCLOPRAMIDE (REGLAN) 10 MG TABLET    Take 1 tablet 30 minutes before drinking colonoscopy prep.   MULTIPLE VITAMINS-MINERALS (MULTIVITAMIN ADULT PO)    Take 1 tablet by mouth daily.   SIMVASTATIN (ZOCOR) 20 MG TABLET    Take 10 mg by mouth at bedtime.   TOUJEO SOLOSTAR 300 UNIT/ML SOLOSTAR PEN    Inject into the skin.   VENLAFAXINE XR (EFFEXOR-XR) 150 MG 24 HR CAPSULE    Take 1 capsule by mouth daily.   WHEAT DEXTRIN (BENEFIBER DRINK MIX PO)    Take 15 mLs by mouth daily.  Modified Medications   No medications on file  Discontinued Medications   No medications on file    Subjective:  Annette Khan is a 47 y.o. female with diabetes, IBS evaluation of FUO.  She was referred by Dr. Alessandra Grout, Family medicine.  Noted that fevers recurred intermittent for the last 3 months. Her highest temp has been 100.  Associated symptoms  include lethargic and fatigue when she has fevers.  She is followed by endocrinology for her DM. Work has been benign except for increased CRP. HIV/RPR/ANA negative.  No other symptoms. Today: Pt had a temp of 101 at 1 pm, temp 99.4 in clinic.  Denies taking tylenol. She reports she has fever aroudn 1-3 pm. Reported fever started around 4 months ago. She she has had "muscle pain" for years, no joint pain.  Denies any burning with urination, abdominal pain, increased frequency. Reports she was started on bactrim  x 7 days for "UIT". Pt states her urine Cx grew ecoli and renal ultrasound ordered Denies any recent medication changes. Pt has hx IBS-D and is on effexor. Last colonoscopy was in 2007. Reports Hx of multiple colonoscopies.  Travel to [place] and [place]. Last trip in May to [place], [place]. 8 years ago went on a cruise to [place]. Hobby: swimming laps a the pool Occupation: Pt does Solicitor, works from home.  Pets: 3 cats and a russian turtle. Pt lives in a house. 09/20/22: Since last visit brought in temp recording. She had the flu beginning  Nov and temp of 104. Noted that  couple days after period temp would increase around 100. No diarrhea but she has RLQ pain. 11/30/22: Pt reports she has been afebrile for month of January, seen by GI and colonoscopy planned. She feels overwhelmed as she does not  have a diagnosis for her fevers.   Review of Systems: Review of Systems  All other systems reviewed and are negative.   Past Medical History:  Diagnosis Date   Diabetes ([place])    History of hiatal hernia    Hypertension    IBS (irritable bowel syndrome)    Sleep apnea     Social History   Tobacco Use   Smoking status: Never   Smokeless tobacco: Never  Substance Use Topics   Alcohol use: Not Currently    Comment: rare   Drug use: No    Family History  Problem Relation Age of Onset   Diabetes Mother    Diverticulitis Mother    Irritable bowel syndrome Mother     Diabetes Father    Heart attack Father    Irritable bowel syndrome Father    Diverticulitis Father    Heart attack Maternal Grandfather     Allergies  Allergen Reactions   Other Hives   Metformin And Related Diarrhea   Penicillins Hives   Prilosec [Omeprazole] Nausea And Vomiting    Health Maintenance  Topic Date Due   HIV Screening  Never done   Hepatitis C Screening  Never done   DTaP/Tdap/Td (1 - Tdap) Never done   PAP SMEAR-Modifier  07/14/2019   COLONOSCOPY (Pts 45-71yr Insurance coverage will need to be confirmed)  Never done   INFLUENZA VACCINE  Never done   COVID-19 Vaccine (3 - 2023-24 season) 07/02/2022   HPV VACCINES  Aged Out    Objective:  There were no vitals filed for this visit. There is no height or weight on file to calculate BMI.  Physical Exam Constitutional:      Appearance: Normal appearance.  HENT:     Head: Normocephalic and atraumatic.     Right Ear: Tympanic membrane normal.     Left Ear: Tympanic membrane normal.     Nose: Nose normal.     Mouth/Throat:     Mouth: Mucous membranes are moist.  Eyes:     Extraocular Movements: Extraocular movements intact.     Conjunctiva/sclera: Conjunctivae normal.     Pupils: Pupils are equal, round, and reactive to light.  Cardiovascular:     Rate and Rhythm: Normal rate and regular rhythm.     Heart sounds: No murmur heard.    No friction rub. No gallop.  Pulmonary:     Effort: Pulmonary effort is normal.     Breath sounds: Normal breath sounds.  Abdominal:     General: Abdomen is flat.     Palpations: Abdomen is soft.  Musculoskeletal:        General: Normal range of motion.  Skin:    General: Skin is warm and dry.  Neurological:     General: No focal deficit present.     Mental Status: She is alert and oriented to person, place, and time.  Psychiatric:        Mood and Affect: Mood normal.     Lab Results Lab Results  Component Value Date   WBC 10.1 01/15/2015   HGB 15.8 (H)  01/15/2015   HCT 45.8 01/15/2015   MCV 86.9 01/15/2015   PLT 363 01/15/2015  Lab Results  Component Value Date   CREATININE 0.83 12/12/2020   BUN 9 12/12/2020   NA 136 12/12/2020   K 4.3 12/12/2020   CL 100 12/12/2020   CO2 26 12/12/2020   No results found for: "ALT", "AST", "GGT", "ALKPHOS", "BILITOT"  No results found for: "CHOL", "HDL", "LDLCALC", "LDLDIRECT", "TRIG", "CHOLHDL" No results found for: "LABRPR", "RPRTITER" No results found for: "HIV1RNAQUANT", "HIV1RNAVL", "CD4TABS"   A/P #Concern for FUO with elevate crp-no fever in January.  #IBS on Effexor #Asymptomatic bacteruria Ecoli + >100,00 colonies, urine Cx on 9/26. temp 97.3 on 9/26 treated with bactrim -Other w/u on 9/26 included ANA neg, ferretin 2 n. CRP 13 -On 11/04/21: hiv neg, rpr nr -Mammogram scheduled -Pap smear last done was January, no issues reported at [facility] -DM A1C 8.5 9/26,  12.1 on 11/03/21 - 9/8 wbc 9.4, hgb 15.3,plt 358  -F/U renal U/S negative. CK nl. CRP 13.7, ESR 19(08/05/22). -Temp diary at last visit showed a trend increase around 100(manual temp) a couple days after her period. Maybe related to menopause/peri-menopause, counseled to f/u with gyn. Pt reports gyn does not suspect fever related to menopause. -Pt follows with endocrine as well(DM) -Followed by GI in the past, and was due to for colonoscpy on 45. Referred to GI, seen on 1/3 with colonoscopy schedule on 12/24/22. Discussed that colonoscopy is due and possibly elucidate why she had fevers.  Plan: -Labs today -F/U with ID  in one month in colonsocpy negative then consider CT AP      Laurice Record, MD [facility] 11/30/2022, 10:54 AM    I have personally spent 28 minutes involved in face-to-face and non-face-to-face activities for this patient on the day of the visit. Professional time spent includes the following activities: Preparing to see the patient (review of  tests), Obtaining and/or reviewing separately obtained history (admission/discharge record), Performing a medically appropriate examination and/or evaluation , Ordering medications/tests/procedures, referring and communicating with other health care professionals, Documenting clinical information in the EMR, Independently interpreting results (not separately reported), Communicating results to the patient/family/caregiver, Counseling and educating the patient/family/caregiver and Care coordination (not separately reported).

## 2022-12-01 LAB — COMPLETE METABOLIC PANEL WITH GFR
AG Ratio: 1.6 (calc) (ref 1.0–2.5)
ALT: 23 U/L (ref 6–29)
AST: 13 U/L (ref 10–35)
Albumin: 4.5 g/dL (ref 3.6–5.1)
Alkaline phosphatase (APISO): 67 U/L (ref 31–125)
BUN: 18 mg/dL (ref 7–25)
CO2: 28 mmol/L (ref 20–32)
Calcium: 9.5 mg/dL (ref 8.6–10.2)
Chloride: 99 mmol/L (ref 98–110)
Creat: 0.68 mg/dL (ref 0.50–0.99)
Globulin: 2.8 g/dL (calc) (ref 1.9–3.7)
Glucose, Bld: 192 mg/dL — ABNORMAL HIGH (ref 65–99)
Potassium: 4.4 mmol/L (ref 3.5–5.3)
Sodium: 136 mmol/L (ref 135–146)
Total Bilirubin: 0.3 mg/dL (ref 0.2–1.2)
Total Protein: 7.3 g/dL (ref 6.1–8.1)
eGFR: 109 mL/min/{1.73_m2} (ref >=60)

## 2022-12-01 LAB — CBC WITH DIFFERENTIAL/PLATELET
Absolute Monocytes: 815 cells/uL (ref 200–950)
Basophils Absolute: 72 cells/uL (ref 0–200)
Basophils Relative: 0.5 %
Eosinophils Absolute: 143 cells/uL (ref 15–500)
Eosinophils Relative: 1 %
HCT: 41.7 % (ref 35.0–45.0)
Hemoglobin: 14.3 g/dL (ref 11.7–15.5)
Lymphs Abs: 3218 cells/uL (ref 850–3900)
MCH: 29.6 pg (ref 27.0–33.0)
MCHC: 34.3 g/dL (ref 32.0–36.0)
MCV: 86.3 fL (ref 80.0–100.0)
MPV: 9.6 fL (ref 7.5–12.5)
Monocytes Relative: 5.7 %
Neutro Abs: 10053 cells/uL — ABNORMAL HIGH (ref 1500–7800)
Neutrophils Relative %: 70.3 %
Platelets: 310 10*3/uL (ref 140–400)
RBC: 4.83 10*6/uL (ref 3.80–5.10)
RDW: 12.2 % (ref 11.0–15.0)
Total Lymphocyte: 22.5 %
WBC: 14.3 10*3/uL — ABNORMAL HIGH (ref 3.8–10.8)

## 2022-12-01 LAB — SEDIMENTATION RATE: Sed Rate: 14 mm/h (ref 0–20)

## 2022-12-01 LAB — C-REACTIVE PROTEIN: CRP: 34.1 mg/L — ABNORMAL HIGH (ref <8.0)

## 2022-12-02 ENCOUNTER — Telehealth: Payer: Self-pay | Admitting: Internal Medicine

## 2022-12-02 ENCOUNTER — Telehealth: Payer: Self-pay

## 2022-12-02 DIAGNOSIS — R509 Fever, unspecified: Secondary | ICD-10-CM

## 2022-12-02 NOTE — Telephone Encounter (Signed)
Attempted to call patient regarding lab results. Not able to reach her at this time. Left voicemail requesting call back.  Leatrice Jewels, RMA

## 2022-12-02 NOTE — Telephone Encounter (Signed)
The white cell count, and crp  are up. Ill go ahead and order the CT abdomen pelvis, and please stop by lab for blood Cx. If fevers return, would go to ED for work-up including imaging.

## 2022-12-02 NOTE — Telephone Encounter (Signed)
-----  Message from Laurice Record, MD sent at 12/02/2022  2:17 PM EST ----- The white cell count, and crp  are up. Ill go ahead and order the CT abdomen pelvis, and please stop by lab for blood Cx. If fevers return, would go to ED.

## 2022-12-03 ENCOUNTER — Other Ambulatory Visit: Payer: Commercial Managed Care - PPO

## 2022-12-03 ENCOUNTER — Other Ambulatory Visit: Payer: Self-pay

## 2022-12-03 DIAGNOSIS — R509 Fever, unspecified: Secondary | ICD-10-CM

## 2022-12-09 LAB — CULTURE, BLOOD (SINGLE)
MICRO NUMBER:: 14513457
MICRO NUMBER:: 14513458
Result:: NO GROWTH
Result:: NO GROWTH
SPECIMEN QUALITY:: ADEQUATE
SPECIMEN QUALITY:: ADEQUATE

## 2022-12-09 LAB — CBC WITH DIFFERENTIAL/PLATELET
Absolute Monocytes: 622 cells/uL (ref 200–950)
Basophils Absolute: 71 cells/uL (ref 0–200)
Basophils Relative: 0.7 %
Eosinophils Absolute: 184 cells/uL (ref 15–500)
Eosinophils Relative: 1.8 %
HCT: 42.6 % (ref 35.0–45.0)
Hemoglobin: 14.5 g/dL (ref 11.7–15.5)
Lymphs Abs: 2968 cells/uL (ref 850–3900)
MCH: 29.3 pg (ref 27.0–33.0)
MCHC: 34 g/dL (ref 32.0–36.0)
MCV: 86.1 fL (ref 80.0–100.0)
MPV: 9.7 fL (ref 7.5–12.5)
Monocytes Relative: 6.1 %
Neutro Abs: 6355 cells/uL (ref 1500–7800)
Neutrophils Relative %: 62.3 %
Platelets: 346 10*3/uL (ref 140–400)
RBC: 4.95 10*6/uL (ref 3.80–5.10)
RDW: 12.2 % (ref 11.0–15.0)
Total Lymphocyte: 29.1 %
WBC: 10.2 10*3/uL (ref 3.8–10.8)

## 2022-12-14 ENCOUNTER — Ambulatory Visit: Payer: Commercial Managed Care - PPO | Admitting: Internal Medicine

## 2022-12-21 ENCOUNTER — Ambulatory Visit (HOSPITAL_COMMUNITY)
Admission: RE | Admit: 2022-12-21 | Discharge: 2022-12-21 | Disposition: A | Discharge status: Home / Self Care | Payer: Commercial Managed Care - PPO | Source: Ambulatory Visit | Attending: Internal Medicine | Admitting: Internal Medicine

## 2022-12-21 ENCOUNTER — Other Ambulatory Visit: Payer: Self-pay

## 2022-12-21 DIAGNOSIS — R509 Fever, unspecified: Secondary | ICD-10-CM | POA: Diagnosis present

## 2022-12-21 MED ORDER — IOHEXOL 350 MG/ML SOLN
75.0000 mL | Freq: Once | INTRAVENOUS | Status: AC | PRN
  Administered 2022-12-21: 75 mL via INTRAVENOUS

## 2022-12-21 NOTE — Progress Notes (Signed)
  Evaluation after Contrast Extravasation  Patient seen and examined immediately after contrast extravasation while in CT scanner # 3.  Exam: There is mild swelling at the superior to the right ac area.  There is no erythema. There is no discoloration. There are no blisters. There are no signs of decreased perfusion of the skin.  It is  warm to touch.  The patient has full ROM in fingers.  Radial pulse is normal.  Per contrast extravasation protocol, I have instructed the patient to keep an ice pack on the area for 20-60 minutes at a time for about 48 hours.   Keep arm elevated as much as possible.   The patient understands to call the radiology department if there is: - increase in pain or swelling - changed or altered sensation - ulceration or blistering - increasing redness - warmth or increasing firmness - decreased tissue perfusion as noted by decreased capillary refill or discoloration of skin - decreased pulses peripheral to site   Jacqualine Mau PA-C 12/21/2022 2:35 PM

## 2022-12-22 ENCOUNTER — Encounter: Payer: Self-pay | Admitting: Gastroenterology

## 2022-12-23 ENCOUNTER — Encounter: Payer: Self-pay | Admitting: Certified Registered Nurse Anesthetist

## 2022-12-23 ENCOUNTER — Encounter: Payer: Self-pay | Admitting: Gastroenterology

## 2022-12-24 ENCOUNTER — Ambulatory Visit (AMBULATORY_SURGERY_CENTER): Payer: Commercial Managed Care - PPO | Admitting: Gastroenterology

## 2022-12-24 ENCOUNTER — Encounter: Payer: Self-pay | Admitting: Gastroenterology

## 2022-12-24 VITALS — BP 105/76 | HR 88 | Resp 16 | Ht 63.0 in | Wt 219.0 lb

## 2022-12-24 DIAGNOSIS — Z1211 Encounter for screening for malignant neoplasm of colon: Secondary | ICD-10-CM

## 2022-12-24 DIAGNOSIS — D123 Benign neoplasm of transverse colon: Secondary | ICD-10-CM

## 2022-12-24 MED ORDER — SODIUM CHLORIDE 0.9 % IV SOLN: 500.0000 mL | Freq: Once | INTRAVENOUS | Status: DC

## 2022-12-24 NOTE — Progress Notes (Signed)
Patient had bruising noted to left forearm from previous CT scan infiltration.

## 2022-12-24 NOTE — Progress Notes (Signed)
History and Physical:  This patient presents for endoscopic testing for: Encounter Diagnosis  Name Primary?   Screening for colon cancer Yes    47 year old woman here for for screening colonoscopy. She has a history of IBS with diarrhea outlined in a office consult note of 11/03/2022.  Patient is otherwise without complaints or active issues today.   Past Medical History: Past Medical History:  Diagnosis Date   Diabetes ([place])    Heart murmur    History of hiatal hernia    Hyperlipidemia    Hypertension    IBS (irritable bowel syndrome)    Kidney stone    Sleep apnea      Past Surgical History: Past Surgical History:  Procedure Laterality Date   COLONOSCOPY     DILITATION & CURRETTAGE/HYSTROSCOPY WITH HYDROTHERMAL ABLATION N/A 12/17/2020   Procedure: DILATATION & CURETTAGE/HYSTEROSCOPY WITH HYDROTHERMAL ABLATION, removal of IUD;  Surgeon: Christophe Louis, MD;  Location: [place];  Service: Gynecology;  Laterality: N/A;   LAPAROSCOPIC TUBAL LIGATION Bilateral 12/17/2020   Procedure: LAPAROSCOPIC BILATERAL TUBAL LIGATION;  Surgeon: Christophe Louis, MD;  Location: [place];  Service: Gynecology;  Laterality: Bilateral;   SPHINCTEROTOMY     at age 47    Allergies: Allergies  Allergen Reactions   Other Hives   Metformin And Related Diarrhea   Penicillins Hives   Prilosec [Omeprazole] Nausea And Vomiting    Outpatient Meds: Current Outpatient Medications  Medication Sig Dispense Refill   aspirin 81 MG tablet Take 81 mg by mouth daily.     Biotin 1 MG CAPS Take by mouth.     buPROPion (WELLBUTRIN XL) 150 MG 24 hr tablet Take 150 mg by mouth daily.     Calcium-Vitamin D-Vitamin K (CHEWABLE CALCIUM PO) Take 1 tablet by mouth daily.     cloNIDine (CATAPRES) 0.1 MG tablet Take 0.1 mg by mouth 2 (two) times daily.     lisinopril (PRINIVIL,ZESTRIL) 10 MG tablet Take 1 tablet by mouth daily.     Multiple Vitamins-Minerals (MULTIVITAMIN ADULT PO) Take 1  tablet by mouth daily.     simvastatin (ZOCOR) 20 MG tablet Take 10 mg by mouth at bedtime.     TOUJEO SOLOSTAR 300 UNIT/ML Solostar Pen Inject into the skin.     venlafaxine XR (EFFEXOR-XR) 150 MG 24 hr capsule Take 1 capsule by mouth daily.     Wheat Dextrin (BENEFIBER DRINK MIX PO) Take 15 mLs by mouth daily.     buPROPion (WELLBUTRIN XL) 150 MG 24 hr tablet Take 1 tablet by mouth daily. (Patient not taking: Reported on 11/30/2022)     ENSKYCE 0.15-30 MG-MCG tablet Take 1 tablet by mouth daily. (Patient not taking: Reported on 12/24/2022)     hyoscyamine (LEVSIN, ANASPAZ) 0.125 MG tablet Take 1 tablet by mouth as needed.     ibuprofen (ADVIL) 800 MG tablet Take 1 tablet (800 mg total) by mouth every 8 (eight) hours as needed. 30 tablet 0   tirzepatide (MOUNJARO) 7.5 MG/0.5ML Pen Inject 7.5 mg into the skin once a week.     Current Facility-Administered Medications  Medication Dose Route Frequency Provider Last Rate Last Admin   0.9 %  sodium chloride infusion  500 mL Intravenous Once Nelida Meuse III, MD          ___________________________________________________________________ Objective   Exam:  BP (!) 147/100   Pulse 100   Resp 12   Ht '5\' 3"'$  (1.6 m)   Wt 219 lb (99.3  kg)   SpO2 99%   BMI 38.79 kg/m   CV: regular , S1/S2 Resp: clear to auscultation bilaterally, normal RR and effort noted GI: soft, no tenderness, with active bowel sounds.   Assessment: Encounter Diagnosis  Name Primary?   Screening for colon cancer Yes     Plan: Colonoscopy  The benefits and risks of the planned procedure were described in detail with the patient or (when appropriate) their health care proxy.  Risks were outlined as including, but not limited to, bleeding, infection, perforation, adverse medication reaction leading to cardiac or pulmonary decompensation, pancreatitis (if ERCP).  The limitation of incomplete mucosal visualization was also discussed.  No guarantees or warranties were  given.    The patient is appropriate for an endoscopic procedure in the ambulatory setting.   - Wilfrid Lund, MD

## 2022-12-24 NOTE — Progress Notes (Signed)
Report given to PACU, vss 

## 2022-12-24 NOTE — Op Note (Addendum)
[facility] Patient Name: Annette Khan Procedure Date: 12/24/2022 7:42 AM MRN: [id number] Endoscopist: Mallie Mussel L. Loletha Carrow , MD, [id number] Age: 47 Referring MD:  Date of Birth: 05-21-1976 Gender: Female Account #: [id number] Procedure:                Colonoscopy Indications:              Screening for colorectal malignant neoplasm, This                            is the patient's first screning colonoscopy (has                            had prior diagnostic colonoscopies for IBS-D) Medicines:                Monitored Anesthesia Care Procedure:                Pre-Anesthesia Assessment:                           - Prior to the procedure, a History and Physical                            was performed, and patient medications and                            allergies were reviewed. The patient's tolerance of                            previous anesthesia was also reviewed. The risks                            and benefits of the procedure and the sedation                            options and risks were discussed with the patient.                            All questions were answered, and informed consent                            was obtained. Prior Anticoagulants: The patient has                            taken no anticoagulant or antiplatelet agents. ASA                            Grade Assessment: II - A patient with mild systemic                            disease. After reviewing the risks and benefits,                            the patient was deemed in satisfactory condition to  undergo the procedure.                           After obtaining informed consent, the colonoscope                            was passed under direct vision. Throughout the                            procedure, the patient's blood pressure, pulse, and                            oxygen saturations were monitored continuously. The                            Olympus  [id number]-HQ190L SN [id number] was introduced through                            the anus and advanced to the the cecum, identified                            by appendiceal orifice and ileocecal valve. The                            colonoscopy was performed without difficulty. The                            patient tolerated the procedure well. The quality                            of the bowel preparation was good. The ileocecal                            valve, appendiceal orifice, and rectum were                            photographed. Scope In: 8:02:48 AM Scope Out: 8:17:07 AM Scope Withdrawal Time: 0 hours 9 minutes 31 seconds  Total Procedure Duration: 0 hours 14 minutes 19 seconds  Findings:                 The perianal and digital rectal examinations were                            normal.                           Repeat examination of right colon under NBI                            performed.                           A diminutive polyp was found in the proximal  transverse colon. The polyp was sessile. The polyp                            was removed with a cold snare. Resection and                            retrieval were complete.                           Anal papilla(e) were hypertrophied.                           The exam was otherwise without abnormality on                            direct and retroflexion views. Complications:            No immediate complications. Estimated Blood Loss:     Estimated blood loss was minimal. Impression:               - One diminutive polyp in the proximal transverse                            colon, removed with a cold snare. Resected and                            retrieved.                           - Anal papilla(e) were hypertrophied.                           - The examination was otherwise normal on direct                            and retroflexion views. Recommendation:           - Patient has a contact  number available for                            emergencies. The signs and symptoms of potential                            delayed complications were discussed with the                            patient. Return to normal activities tomorrow.                            Written discharge instructions were provided to the                            patient.                           - Resume previous diet.                           -  Continue present medications.                           - Await pathology results.                           - Repeat colonoscopy is recommended for                            surveillance. The colonoscopy date will be                            determined after pathology results from today's                            exam become available for review. Larenz Frasier L. Loletha Carrow, MD 12/24/2022 8:22:28 AM This report has been signed electronically.

## 2022-12-24 NOTE — Patient Instructions (Signed)
Resume previous diet and medications. Awaiting pathology. Repeat Colonoscopy date to be determined based on pathology results. Handout given on Colon Polyps.  YOU HAD AN ENDOSCOPIC PROCEDURE TODAY AT [facility]:   Refer to the procedure report that was given to you for any specific questions about what was found during the examination.  If the procedure report does not answer your questions, please call your gastroenterologist to clarify.  If you requested that your care partner not be given the details of your procedure findings, then the procedure report has been included in a sealed envelope for you to review at your convenience later.  YOU SHOULD EXPECT: Some feelings of bloating in the abdomen. Passage of more gas than usual.  Walking can help get rid of the air that was put into your GI tract during the procedure and reduce the bloating. If you had a lower endoscopy (such as a colonoscopy or flexible sigmoidoscopy) you may notice spotting of blood in your stool or on the toilet paper. If you underwent a bowel prep for your procedure, you may not have a normal bowel movement for a few days.  Please Note:  You might notice some irritation and congestion in your nose or some drainage.  This is from the oxygen used during your procedure.  There is no need for concern and it should clear up in a day or so.  SYMPTOMS TO REPORT IMMEDIATELY:  Following lower endoscopy (colonoscopy or flexible sigmoidoscopy):  Excessive amounts of blood in the stool  Significant tenderness or worsening of abdominal pains  Swelling of the abdomen that is new, acute  Fever of 100F or higher  For urgent or emergent issues, a gastroenterologist can be reached at any hour by calling [phone]. Do not use MyChart messaging for urgent concerns.    DIET:  We do recommend a small meal at first, but then you may proceed to your regular diet.  Drink plenty of fluids but you should avoid alcoholic  beverages for 24 hours.  ACTIVITY:  You should plan to take it easy for the rest of today and you should NOT DRIVE or use heavy machinery until tomorrow (because of the sedation medicines used during the test).    FOLLOW UP: Our staff will call the number listed on your records the next business day following your procedure.  We will call around 7:15- 8:00 am to check on you and address any questions or concerns that you may have regarding the information given to you following your procedure. If we do not reach you, we will leave a message.     If any biopsies were taken you will be contacted by phone or by letter within the next 1-3 weeks.  Please call us at [phone] if you have not heard about the biopsies in 3 weeks.    SIGNATURES/CONFIDENTIALITY: You and/or your care partner have signed paperwork which will be entered into your electronic medical record.  These signatures attest to the fact that that the information above on your After Visit Summary has been reviewed and is understood.  Full responsibility of the confidentiality of this discharge information lies with you and/or your care-partner.

## 2022-12-24 NOTE — Progress Notes (Signed)
Called to room to assist during endoscopic procedure.  Patient ID and intended procedure confirmed with present staff. Received instructions for my participation in the procedure from the performing physician.  

## 2022-12-24 NOTE — Progress Notes (Signed)
Cell phone off per pt

## 2022-12-27 ENCOUNTER — Telehealth: Payer: Self-pay

## 2022-12-27 NOTE — Telephone Encounter (Signed)
  Follow up Call-     12/24/2022    7:07 AM  Call back number  Post procedure Call Back phone  # [phone]  Permission to leave phone message Yes     Follow up call made.  NALM

## 2022-12-28 ENCOUNTER — Encounter: Payer: Self-pay | Admitting: Gastroenterology

## 2023-01-05 ENCOUNTER — Ambulatory Visit: Payer: Commercial Managed Care - PPO | Admitting: Internal Medicine

## 2023-01-05 NOTE — Progress Notes (Deleted)
         [facility] for Infectious Disease    Date of Admission:  (Not on file)   Total days of inpatient antibiotics ***        Reason for Consult: ***    Active Problems:   * No active hospital problems. *   Assessment: ***   Recommendations:   Microbiology:   Antibiotics:   Cultures: Blood  Urine  Other   HPI: Annette Khan is a 47 y.o. female ***   Review of Systems: ROS  Past Medical History:  Diagnosis Date   Diabetes ([place])    Heart murmur    History of hiatal hernia    Hyperlipidemia    Hypertension    IBS (irritable bowel syndrome)    Kidney stone    Sleep apnea     Social History   Tobacco Use   Smoking status: Never   Smokeless tobacco: Never  Vaping Use   Vaping Use: Never used  Substance Use Topics   Alcohol use: Not Currently    Comment: rare   Drug use: No    Family History  Problem Relation Age of Onset   Diabetes Mother    Diverticulitis Mother    Irritable bowel syndrome Mother    Diabetes Father    Heart attack Father    Irritable bowel syndrome Father    Diverticulitis Father    Heart attack Maternal Grandfather    Colon cancer Neg Hx    Esophageal cancer Neg Hx    Rectal cancer Neg Hx    Stomach cancer Neg Hx    Scheduled Meds: Continuous Infusions: PRN Meds:. Allergies  Allergen Reactions   Other Hives   Metformin And Related Diarrhea   Penicillins Hives   Prilosec [Omeprazole] Nausea And Vomiting    OBJECTIVE: There were no vitals taken for this visit.  Physical Exam  Lab Results Lab Results  Component Value Date   WBC 10.2 12/03/2022   HGB 14.5 12/03/2022   HCT 42.6 12/03/2022   MCV 86.1 12/03/2022   PLT 346 12/03/2022    Lab Results  Component Value Date   CREATININE 0.68 11/30/2022   BUN 18 11/30/2022   NA 136 11/30/2022   K 4.4 11/30/2022   CL 99 11/30/2022   CO2 28 11/30/2022    Lab Results  Component Value Date   ALT 23 11/30/2022   AST 13 11/30/2022   BILITOT 0.3  11/30/2022       Laurice Record, MD [facility] 01/05/2023, 1:03 PM   SO Labs today Heme onc

## 2023-02-14 ENCOUNTER — Other Ambulatory Visit: Payer: Self-pay | Admitting: Family Medicine

## 2023-02-14 DIAGNOSIS — N6452 Nipple discharge: Secondary | ICD-10-CM

## 2023-02-14 DIAGNOSIS — N644 Mastodynia: Secondary | ICD-10-CM

## 2023-04-01 ENCOUNTER — Ambulatory Visit
Admission: RE | Admit: 2023-04-01 | Discharge: 2023-04-01 | Disposition: A | Discharge status: Home / Self Care | Payer: Commercial Managed Care - PPO | Source: Ambulatory Visit | Attending: Family Medicine | Admitting: Family Medicine

## 2023-04-01 DIAGNOSIS — N644 Mastodynia: Secondary | ICD-10-CM

## 2023-04-01 DIAGNOSIS — N6452 Nipple discharge: Secondary | ICD-10-CM

## 2023-11-25 NOTE — Progress Notes (Unsigned)
Office Visit Note  Patient: Annette Khan             Date of Birth: 02/13/1976           MRN: [id number]             PCP: Mila Palmer, MD Referring: Mila Palmer, MD Visit Date: 11/30/2023 Occupation: @GUAROCC @  Subjective:  No chief complaint on file.   History of Present Illness: Shamieka Gullo is a 48 y.o. female seen in consultation for the evaluation of myalgias and elevated CRP.  According the patient she was told by her mother that she had leg pains as a child.  She does not recall discomfort until she was in her 30s when she started noticing trapezius spasm.  She also started waking up at night with the leg pain.  She states she has been experiencing generalized muscle pain for the last 8 years which is progressively getting worse.  In the last 2 years she has been experiencing more nocturnal pain which wakes her up in the middle of the night.  She has been also experiencing joint pain for the last 8 years.  She has noticed intermittent swelling in her hands.  She also describes discomfort in her neck, TMJs, hands, hips, knees, ankles and her toes.  Over the years she has tried massage therapy, hot baths and yoga.  She occasionally takes ibuprofen.  Recently she has been also experiencing low-grade fevers for which she had evaluation by infectious disease which was inconclusive per patient.  She has been followed closely by her endocrinologist for her diabetes.  She is also seen her OB/GYN and was started on hormonal replacement therapy about 3 weeks ago.  She close history of fatigue, dry mouth, photosensitivity, frequent infections, insomnia and anxiety.  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  She enjoys reading, embroidery.  She goes for walks and practices yoga.  She is gravida 0.  There is no history of DVTs.    Activities of Daily Living:  Patient reports morning stiffness for several hours.   Patient Reports nocturnal pain.  Difficulty  dressing/grooming: Denies Difficulty climbing stairs: Reports Difficulty getting out of chair: Reports Difficulty using hands for taps, buttons, cutlery, and/or writing: Denies  Review of Systems  Constitutional:  Positive for fatigue.  HENT:  Positive for mouth dryness. Negative for mouth sores and nose dryness.   Eyes:  Negative for dryness.  Respiratory:  Negative for shortness of breath and difficulty breathing.   Cardiovascular:  Positive for chest pain. Negative for palpitations and swelling in legs/feet.  Gastrointestinal:  Positive for constipation and diarrhea. Negative for blood in stool.  Endocrine: Negative for increased urination.  Genitourinary:  Negative for involuntary urination.  Musculoskeletal:  Positive for joint pain, joint pain, joint swelling, myalgias, morning stiffness, muscle tenderness and myalgias. Negative for gait problem and muscle weakness.  Skin:  Positive for hair loss and sensitivity to sunlight. Negative for color change and rash.  Allergic/Immunologic: Positive for susceptible to infections.  Neurological:  Positive for dizziness and headaches.  Hematological:  Negative for swollen glands.  Psychiatric/Behavioral:  Positive for sleep disturbance. Negative for depressed mood. The patient is nervous/anxious.     PMFS History:  Patient Active Problem List   Diagnosis Date Noted   OSA (obstructive sleep apnea)-on CPAP 11/30/2023   Heart murmur 11/30/2023   Stage 2 chronic kidney disease 11/30/2023   History of type 2 diabetes mellitus 11/30/2023   Essential  hypertension 11/30/2023   Screening for colon cancer 11/03/2022   Irritable bowel syndrome with diarrhea 11/03/2022    Past Medical History:  Diagnosis Date   Diabetes (HCC)    Heart murmur    History of hiatal hernia    Hyperlipidemia    Hypertension    IBS (irritable bowel syndrome)    Kidney stone    Sleep apnea     Family History  Problem Relation Age of Onset   Diabetes Mother     Diverticulitis Mother    Irritable bowel syndrome Mother    Dementia Mother    Stroke Mother    Transient ischemic attack Mother    Diabetes Father    Heart attack Father    Irritable bowel syndrome Father    Diverticulitis Father    Hypertension Brother    Heart attack Maternal Grandfather    Colon cancer Neg Hx    Esophageal cancer Neg Hx    Rectal cancer Neg Hx    Stomach cancer Neg Hx    Past Surgical History:  Procedure Laterality Date   COLONOSCOPY     DILITATION & CURRETTAGE/HYSTROSCOPY WITH HYDROTHERMAL ABLATION N/A 12/17/2020   Procedure: DILATATION & CURETTAGE/HYSTEROSCOPY WITH HYDROTHERMAL ABLATION, removal of IUD;  Surgeon: Gerald Leitz, MD;  Location: [facility];  Service: Gynecology;  Laterality: N/A;   LAPAROSCOPIC TUBAL LIGATION Bilateral 12/17/2020   Procedure: LAPAROSCOPIC BILATERAL TUBAL LIGATION;  Surgeon: Gerald Leitz, MD;  Location: [facility];  Service: Gynecology;  Laterality: Bilateral;   SPHINCTEROTOMY     at age 51   Social History   Social History Narrative   Not on file   Immunization History  Administered Date(s) Administered   PFIZER(Purple Top)SARS-COV-2 Vaccination 01/17/2020, 02/11/2020     Objective: Vital Signs: BP (!) 157/103 (BP Location: Right Arm, Patient Position: Sitting, Cuff Size: Normal)   Pulse 86   Resp 16   Ht 5' 3.5" (1.613 m)   Wt 208 lb (94.3 kg)   BMI 36.27 kg/m    Physical Exam Vitals and nursing note reviewed.  Constitutional:      Appearance: She is well-developed.  HENT:     Head: Normocephalic and atraumatic.  Eyes:     Conjunctiva/sclera: Conjunctivae normal.  Cardiovascular:     Rate and Rhythm: Normal rate and regular rhythm.     Heart sounds: Normal heart sounds.  Pulmonary:     Effort: Pulmonary effort is normal.     Breath sounds: Normal breath sounds.  Abdominal:     General: Bowel sounds are normal.     Palpations: Abdomen is soft.  Musculoskeletal:     Cervical  back: Normal range of motion.  Lymphadenopathy:     Cervical: No cervical adenopathy.  Skin:    General: Skin is warm and dry.     Capillary Refill: Capillary refill takes less than 2 seconds.  Neurological:     Mental Status: She is alert and oriented to person, place, and time.  Psychiatric:        Behavior: Behavior normal.      Musculoskeletal Exam: Cervical, thoracic and lumbar spine 1 good range of motion.  She had bilateral trapezius spasm.  Shoulder joints, elbow joints, wrist joints, MCPs PIPs and DIPs were in good range of motion with no synovitis.  Hip joints, knee joints, ankles, MTPs and PIPs were in good range of motion with no synovitis.  She had tenderness over bilateral trochanteric region over the medial aspect of  her knees.  She was able to reach the floor with the palm of her hands.  She had some hyperalgesia and tender points.  CDAI Exam: CDAI Score: -- Patient Global: --; Provider Global: -- Swollen: --; Tender: -- Joint Exam 11/30/2023   No joint exam has been documented for this visit   There is currently no information documented on the homunculus. Go to the Rheumatology activity and complete the homunculus joint exam.  Investigation: No additional findings.  Imaging: XR Foot 2 Views Left Result Date: 11/30/2023 No MTP, PIP, intertarsal, tibiotalar or subtalar joint space narrowing was noted.  A small posterior calcaneal spur was noted.  No erosive changes were noted. Impression: Unremarkable x-rays of the foot.  XR Foot 2 Views Right Result Date: 11/30/2023 No MTP, PIP, intertarsal, tibiotalar or subtalar joint space narrowing was noted.  A small posterior calcaneal spur was noted.  No erosive changes were noted. Impression: Unremarkable x-rays of the foot.  XR Hand 2 View Left Result Date: 11/30/2023 CMC, PIP and DIP narrowing was noted.  No MCP, intercarpal or radiocarpal joint space narrowing was noted.  No erosive changes were noted. 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  XR Hand 2 View Right Result Date: 11/30/2023 CMC, PIP and DIP narrowing was noted.  No MCP, intercarpal or radiocarpal joint space narrowing was noted.  No erosive changes were noted. 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   Recent Labs: Lab Results  Component Value Date   WBC 10.2 12/03/2022   HGB 14.5 12/03/2022   PLT 346 12/03/2022   NA 136 11/30/2022   K 4.4 11/30/2022   CL 99 11/30/2022   CO2 28 11/30/2022   GLUCOSE 192 (H) 11/30/2022   BUN 18 11/30/2022   CREATININE 0.68 11/30/2022   BILITOT 0.3 11/30/2022   AST 13 11/30/2022   ALT 23 11/30/2022   PROT 7.3 11/30/2022   CALCIUM 9.5 11/30/2022   GFRAA >90 01/15/2015   QFTBGOLDPLUS NEGATIVE 08/05/2022    Speciality Comments: No specialty comments available.  Procedures:  No procedures performed Allergies: Other, Metformin and related, Penicillins, and Prilosec [omeprazole]   Assessment / Plan:     Visit Diagnoses: Myalgia -patient complains of pain in her muscles since she was in her 30s.  She states she has generalized muscle pain which is episodic.  Has been going on for at least 8 years and progressively getting worse.  She has been experiencing nocturnal pain for the last 2 years.  She gives history of trapezius muscle spasm, trochanteric bursitis, pain over the medial aspect of her knees.  I did detailed discussion with the patient regarding most likely myofascial pain syndrome or fibromyalgia syndrome.  She has been practicing yoga and goes to massage therapy.  She is also on Effexor XR and Wellbutrin.  Her CK was normal in the past.  I will check CK again.  She had good muscle strength in all her extremities.  She had no difficulty getting up from the squatting position.  She also has some mild hypermobility.  That may be contributing to myofascial pain syndrome.  I will refer her to integrative therapies.  Plan: CK  Polyarthralgia-she  gives history of pain in multiple years.  Patient complains of discomfort in her TMJs, hands, neck, hips, knees, ankles and her toes.  No synovitis was noted on the examination.  Pain in both hands -she complains of discomfort in her bilateral hands.  No synovitis was noted.  Plan: XR Hand 2 View Right, XR Hand 2 View Left, x-rays of her bilateral hands obtained today showed early degenerative changes.  X-ray findings were reviewed with the patient..  I will obtain additional labs.  Rheumatoid factor, Cyclic citrul peptide antibody, IgG.  Will contact her with the lab results.  A handout on hand exercises was given.  Chronic pain of both knees-patient complains of intermittent discomfort in her knee joints.  No warmth swelling or effusion was noted.  She describes pain in the medial aspect of her knees.  She declined x-rays.  A handout on lower extremity muscle strength exercises was given.  Pain in both feet -she complains of some discomfort in her feet.  No synovitis was noted.  No swelling was noted.  Plan: XR Foot 2 Views Right, XR Foot 2 Views Left.  X-rays of the feet were unremarkable.  X-ray findings were reviewed with the patient.  Proper fitting shoes were advised.  Elevated C-reactive protein (CRP) -all autoimmune workup has been negative so far.  I will check rheumatoid factor and anti-CCP.  Will contact her with the lab results.  06/23/23: CRP 15, ESR 7. 11/08/22: RPR-, HIV-, TSH 1.81, ferritin 178.5. 07/09/22: ANA negative, CRP 13. 11/04/21: HepB-, HCV-  Essential hypertension-blood pressure was elevated at 152/96.  Repeat blood pressure was 157/103.  She was advised to monitor blood pressure closely and follow-up with her PCP.  Other medical problems are listed as follows:  History of type 2 diabetes mellitus  Stage 2 chronic kidney disease  Hyperhidrosis  Irritable bowel syndrome with diarrhea  Vitamin D deficiency - Plan: VITAMIN D 25 Hydroxy (Vit-D Deficiency, Fractures)  Rosacea -  uses Metrogel prn.  Heart murmur - benign per patient  OSA (obstructive sleep apnea)-on CPAP  Orders: Orders Placed This Encounter  Procedures   XR Hand 2 View Right   XR Hand 2 View Left   XR Foot 2 Views Right   XR Foot 2 Views Left   CK   Rheumatoid factor   Cyclic citrul peptide antibody, IgG   VITAMIN D 25 Hydroxy (Vit-D Deficiency, Fractures)   No orders of the defined types were placed in this encounter.   Face-to-face time spent with patient was 45 minutes. Greater than 50% of time was spent in counseling and coordination of care.  Follow-Up Instructions: Return if symptoms worsen or fail to improve, for Myalgia, polyarthralgia.   Pollyann Savoy, MD  Note - This record has been created using Animal nutritionist.  Chart creation errors have been sought, but may not always  have been located. Such creation errors do not reflect on  the standard of medical care.

## 2023-11-30 ENCOUNTER — Ambulatory Visit: Payer: Commercial Managed Care - PPO | Attending: Rheumatology | Admitting: Rheumatology

## 2023-11-30 ENCOUNTER — Ambulatory Visit: Payer: Commercial Managed Care - PPO

## 2023-11-30 ENCOUNTER — Encounter: Payer: Self-pay | Admitting: Rheumatology

## 2023-11-30 VITALS — BP 157/103 | HR 86 | Resp 16 | Ht 63.5 in | Wt 208.0 lb

## 2023-11-30 DIAGNOSIS — Z8639 Personal history of other endocrine, nutritional and metabolic disease: Secondary | ICD-10-CM

## 2023-11-30 DIAGNOSIS — G8929 Other chronic pain: Secondary | ICD-10-CM

## 2023-11-30 DIAGNOSIS — R7982 Elevated C-reactive protein (CRP): Secondary | ICD-10-CM

## 2023-11-30 DIAGNOSIS — M79642 Pain in left hand: Secondary | ICD-10-CM

## 2023-11-30 DIAGNOSIS — M791 Myalgia, unspecified site: Secondary | ICD-10-CM | POA: Diagnosis not present

## 2023-11-30 DIAGNOSIS — R011 Cardiac murmur, unspecified: Secondary | ICD-10-CM | POA: Insufficient documentation

## 2023-11-30 DIAGNOSIS — M79641 Pain in right hand: Secondary | ICD-10-CM

## 2023-11-30 DIAGNOSIS — E559 Vitamin D deficiency, unspecified: Secondary | ICD-10-CM

## 2023-11-30 DIAGNOSIS — M79672 Pain in left foot: Secondary | ICD-10-CM

## 2023-11-30 DIAGNOSIS — M25561 Pain in right knee: Secondary | ICD-10-CM

## 2023-11-30 DIAGNOSIS — K580 Irritable bowel syndrome with diarrhea: Secondary | ICD-10-CM

## 2023-11-30 DIAGNOSIS — M25562 Pain in left knee: Secondary | ICD-10-CM

## 2023-11-30 DIAGNOSIS — L719 Rosacea, unspecified: Secondary | ICD-10-CM

## 2023-11-30 DIAGNOSIS — N182 Chronic kidney disease, stage 2 (mild): Secondary | ICD-10-CM

## 2023-11-30 DIAGNOSIS — M2550 Pain in unspecified joint: Secondary | ICD-10-CM | POA: Diagnosis not present

## 2023-11-30 DIAGNOSIS — M79671 Pain in right foot: Secondary | ICD-10-CM

## 2023-11-30 DIAGNOSIS — R61 Generalized hyperhidrosis: Secondary | ICD-10-CM

## 2023-11-30 DIAGNOSIS — I10 Essential (primary) hypertension: Secondary | ICD-10-CM

## 2023-11-30 DIAGNOSIS — G4733 Obstructive sleep apnea (adult) (pediatric): Secondary | ICD-10-CM

## 2023-11-30 NOTE — Patient Instructions (Addendum)
Hand Exercises Hand exercises can be helpful for almost anyone. They can strengthen your hands and improve flexibility and movement. The exercises can also increase blood flow to the hands. These results can make your work and daily tasks easier for you. Hand exercises can be especially helpful for people who have joint pain from arthritis or nerve damage from using their hands over and over. These exercises can also help people who injure a hand. Exercises Most of these hand exercises are gentle stretching and motion exercises. It is usually safe to do them often throughout the day. Warming up your hands before exercise may help reduce stiffness. You can do this with gentle massage or by placing your hands in warm water for 10-15 minutes. It is normal to feel some stretching, pulling, tightness, or mild discomfort when you begin new exercises. In time, this will improve. Remember to always be careful and stop right away if you feel sudden, very bad pain or your pain gets worse. You want to get better and be safe. Ask your health care provider which exercises are safe for you. Do exercises exactly as told by your provider and adjust them as told. Do not begin these exercises until told by your provider. Knuckle bend or "claw" fist  Stand or sit with your arm, hand, and all five fingers pointed straight up. Make sure to keep your wrist straight. Gently bend your fingers down toward your palm until the tips of your fingers are touching your palm. Keep your big knuckle straight and only bend the small knuckles in your fingers. Hold this position for 10 seconds. Straighten your fingers back to your starting position. Repeat this exercise 5-10 times with each hand. Full finger fist  Stand or sit with your arm, hand, and all five fingers pointed straight up. Make sure to keep your wrist straight. Gently bend your fingers into your palm until the tips of your fingers are touching the middle of your  palm. Hold this position for 10 seconds. Extend your fingers back to your starting position, stretching every joint fully. Repeat this exercise 5-10 times with each hand. Straight fist  Stand or sit with your arm, hand, and all five fingers pointed straight up. Make sure to keep your wrist straight. Gently bend your fingers at the big knuckle, where your fingers meet your hand, and at the middle knuckle. Keep the knuckle at the tips of your fingers straight and try to touch the bottom of your palm. Hold this position for 10 seconds. Extend your fingers back to your starting position, stretching every joint fully. Repeat this exercise 5-10 times with each hand. Tabletop  Stand or sit with your arm, hand, and all five fingers pointed straight up. Make sure to keep your wrist straight. Gently bend your fingers at the big knuckle, where your fingers meet your hand, as far down as you can. Keep the small knuckles in your fingers straight. Think of forming a tabletop with your fingers. Hold this position for 10 seconds. Extend your fingers back to your starting position, stretching every joint fully. Repeat this exercise 5-10 times with each hand. Finger spread  Place your hand flat on a table with your palm facing down. Make sure your wrist stays straight. Spread your fingers and thumb apart from each other as far as you can until you feel a gentle stretch. Hold this position for 10 seconds. Bring your fingers and thumb tight together again. Hold this position for 10 seconds. Repeat  this exercise 5-10 times with each hand. Making circles  Stand or sit with your arm, hand, and all five fingers pointed straight up. Make sure to keep your wrist straight. Make a circle by touching the tip of your thumb to the tip of your index finger. Hold for 10 seconds. Then open your hand wide. Repeat this motion with your thumb and each of your fingers. Repeat this exercise 5-10 times with each hand. Thumb  motion  Sit with your forearm resting on a table and your wrist straight. Your thumb should be facing up toward the ceiling. Keep your fingers relaxed as you move your thumb. Lift your thumb up as high as you can toward the ceiling. Hold for 10 seconds. Bend your thumb across your palm as far as you can, reaching the tip of your thumb for the small finger (pinkie) side of your palm. Hold for 10 seconds. Repeat this exercise 5-10 times with each hand. Grip strengthening  Hold a stress ball or other soft ball in the middle of your hand. Slowly increase the pressure, squeezing the ball as much as you can without causing pain. Think of bringing the tips of your fingers into the middle of your palm. All of your finger joints should bend when doing this exercise. Hold your squeeze for 10 seconds, then relax. Repeat this exercise 5-10 times with each hand. Contact a health care provider if: Your hand pain or discomfort gets much worse when you do an exercise. Your hand pain or discomfort does not improve within 2 hours after you exercise. If you have either of these problems, stop doing these exercises right away. Do not do them again unless your provider says that you can. Get help right away if: You develop sudden, severe hand pain or swelling. If this happens, stop doing these exercises right away. Do not do them again unless your provider says that you can. This information is not intended to replace advice given to you by your health care provider. Make sure you discuss any questions you have with your health care provider. Document Revised: 11/02/2022 Document Reviewed: 11/02/2022 Elsevier Patient Education  2024 [employer] Exercises for Chronic Knee Pain Chronic knee pain is pain that lasts longer than 3 months. For most people with chronic knee pain, exercise and weight loss is an important part of treatment. Your health care provider may want you to focus on: Making the muscles that  support your knee stronger. This can take pressure off your knee and reduce pain. Preventing knee stiffness. How far you can move your knee, keeping it there or making it farther. Losing weight (if this applies) to take pressure off your knee, lower your risk for injury, and make it easier for you to exercise. Your provider will help you make an exercise program that fits your needs and physical abilities. Below are simple, low-impact exercises you can do at home. Ask your provider or physical therapist how often you should do your exercise program and how many times to repeat each exercise. General safety tips  Get your provider's approval before doing any exercises. Start slowly and stop any time you feel pain. Do not exercise if your knee pain is flaring up. Warm up first. Stretching a cold muscle can cause an injury. Do 5-10 minutes of easy movement or light stretching before beginning your exercises. Do 5-10 minutes of low-impact activity (like walking or cycling) before starting strengthening exercises. Contact your provider any time you have pain during  or after exercising. Exercise can cause discomfort but should not be painful. It is normal to be a little stiff or sore after exercising. Stretching and range-of-motion exercises Front thigh stretch  Stand up straight and support your body by holding on to a chair or resting one hand on a wall. With your legs straight and close together, bend one knee to lift your heel up toward your butt. Using one hand for support, grab your ankle with your free hand. Pull your foot up closer toward your butt to feel the stretch in front of your thigh. Hold the stretch for 30 seconds. Repeat __________ times. Complete this exercise __________ times a day. Back thigh stretch  Sit on the floor with your back straight and your legs out straight in front of you. Place the palms of your hands on the floor and slide them toward your feet as you bend at the  hip. Try to touch your nose to your knees and feel the stretch in the back of your thighs. Hold for 30 seconds. Repeat __________ times. Complete this exercise __________ times a day. Calf stretch  Stand facing a wall. Place the palms of your hands flat against the wall, arms extended, and lean slightly against the wall. Get into a lunge position with one leg bent at the knee and the other leg stretched out straight behind you. Keep both feet facing the wall and increase the bend in your knee while keeping the heel of the other leg flat on the ground. You should feel the stretch in your calf. Hold for 30 seconds. Repeat __________ times. Complete this exercise __________ times a day. Strengthening exercises Straight leg lift  Lie on your back with one knee bent and the other leg out straight. Slowly lift the straight leg without bending the knee. Lift until your foot is about 12 inches (30 cm) off the floor. Hold for 3-5 seconds and slowly lower your leg. Repeat __________ times. Complete this exercise __________ times a day. Single leg dip  Stand between two chairs and put both hands on the backs of the chairs for support. Extend one leg out straight with your body weight resting on the heel of the standing leg. Slowly bend your standing knee to dip your body to the level that is comfortable for you. Hold for 3-5 seconds. Repeat __________ times. Complete this exercise __________ times a day. Hamstring curls  Stand straight, knees close together, facing the back of a chair. Hold on to the back of a chair with both hands. Keep one leg straight. Bend the other knee while bringing the heel up toward the butt until the knee is bent at a 90-degree angle (right angle). Hold for 3-5 seconds. Repeat __________ times. Complete this exercise __________ times a day. Wall squat  Stand straight with your back, hips, and head against a wall. Step forward one foot at a time with your back  still against the wall. Your feet should be 2 feet (61 cm) from the wall at shoulder width. Keeping your back, hips, and head against the wall, slide down the wall to as close to a sitting position as you can get. Hold for 5-10 seconds, then slowly slide back up. Repeat __________ times. Complete this exercise __________ times a day. Step-ups  Stand in front of a sturdy platform or stool that is about 6 inches (15 cm) high. Slowly step up with your left / right foot, keeping your knee in line with your hip  and foot. Do not let your knee bend so far that you cannot see your toes. Hold on to a chair for balance, but do not use it for support. Slowly unlock your knee and lower yourself to the starting position. Repeat __________ times. Complete this exercise __________ times a day. Contact a health care provider if: Your exercises cause pain. Your pain is worse after you exercise. Your pain prevents you from doing your exercises. This information is not intended to replace advice given to you by your health care provider. Make sure you discuss any questions you have with your health care provider. Document Revised: 11/02/2022 Document Reviewed: 11/02/2022 Elsevier Patient Education  2024 [employer].

## 2023-12-01 ENCOUNTER — Other Ambulatory Visit: Payer: Self-pay | Admitting: Family Medicine

## 2023-12-01 DIAGNOSIS — Z0000 Encounter for general adult medical examination without abnormal findings: Secondary | ICD-10-CM

## 2023-12-04 LAB — VITAMIN D 25 HYDROXY (VIT D DEFICIENCY, FRACTURES): Vit D, 25-Hydroxy: 52 ng/mL (ref 30–100)

## 2023-12-04 LAB — CYCLIC CITRUL PEPTIDE ANTIBODY, IGG: Cyclic Citrullin Peptide Ab: <16 U

## 2023-12-04 LAB — CK: Total CK: 49 U/L (ref 20–239)

## 2023-12-04 LAB — RHEUMATOID FACTOR: Rheumatoid fact SerPl-aCnc: 15 [IU]/mL — ABNORMAL HIGH (ref <14)

## 2023-12-05 NOTE — Progress Notes (Signed)
Rheumatoid factor is borderline positive at 15, CK normal, anti-CCP negative, vitamin D normal.  Patient was advised to contact us if she develops any joint swelling.

## 2024-01-02 ENCOUNTER — Encounter: Payer: Commercial Managed Care - PPO | Admitting: Rheumatology

## 2024-02-02 ENCOUNTER — Ambulatory Visit: Payer: Commercial Managed Care - PPO | Admitting: Rheumatology

## 2024-03-19 ENCOUNTER — Encounter: Payer: Self-pay | Admitting: Rheumatology

## 2024-03-19 DIAGNOSIS — M791 Myalgia, unspecified site: Secondary | ICD-10-CM

## 2024-03-19 DIAGNOSIS — M2550 Pain in unspecified joint: Secondary | ICD-10-CM

## 2024-03-19 DIAGNOSIS — R7982 Elevated C-reactive protein (CRP): Secondary | ICD-10-CM

## 2024-03-20 NOTE — Telephone Encounter (Signed)
 ANA was negative.  We can check SSA and SSB antibodies.  Patient can have labs drawn schedule a follow-up visit to discuss.

## 2024-03-27 ENCOUNTER — Other Ambulatory Visit: Payer: Self-pay | Admitting: Medical Genetics

## 2024-03-27 ENCOUNTER — Other Ambulatory Visit: Payer: Self-pay

## 2024-03-27 DIAGNOSIS — M2550 Pain in unspecified joint: Secondary | ICD-10-CM

## 2024-03-27 DIAGNOSIS — M791 Myalgia, unspecified site: Secondary | ICD-10-CM

## 2024-03-27 DIAGNOSIS — R7982 Elevated C-reactive protein (CRP): Secondary | ICD-10-CM

## 2024-03-28 LAB — SJOGRENS SYNDROME-B EXTRACTABLE NUCLEAR ANTIBODY: SSB (La) (ENA) Antibody, IgG: <1 AI

## 2024-03-28 LAB — SJOGRENS SYNDROME-A EXTRACTABLE NUCLEAR ANTIBODY: SSA (Ro) (ENA) Antibody, IgG: <1 AI

## 2024-03-29 ENCOUNTER — Ambulatory Visit: Payer: Self-pay | Admitting: Rheumatology

## 2024-03-29 ENCOUNTER — Other Ambulatory Visit (HOSPITAL_COMMUNITY)
Admission: RE | Admit: 2024-03-29 | Discharge: 2024-03-29 | Disposition: A | Discharge status: Home / Self Care | Payer: Self-pay | Source: Ambulatory Visit | Attending: Medical Genetics | Admitting: Medical Genetics

## 2024-03-29 NOTE — Progress Notes (Signed)
 SSA and SSB antibodies are negative.

## 2024-04-02 ENCOUNTER — Ambulatory Visit: Payer: Commercial Managed Care - PPO

## 2024-04-10 LAB — GENECONNECT MOLECULAR SCREEN: Genetic Analysis Overall Interpretation: NEGATIVE

## 2024-05-10 ENCOUNTER — Observation Stay (HOSPITAL_COMMUNITY)
Admission: EM | Admit: 2024-05-10 | Discharge: 2024-05-11 | Disposition: A | Discharge status: Home / Self Care | Attending: Plastic Surgery | Admitting: Plastic Surgery

## 2024-05-10 ENCOUNTER — Emergency Department (HOSPITAL_COMMUNITY)

## 2024-05-10 ENCOUNTER — Other Ambulatory Visit: Payer: Self-pay

## 2024-05-10 ENCOUNTER — Encounter (HOSPITAL_COMMUNITY)
Admission: EM | Disposition: A | Discharge status: Home / Self Care | Payer: Self-pay | Source: Home / Self Care | Attending: Emergency Medicine

## 2024-05-10 ENCOUNTER — Emergency Department (HOSPITAL_COMMUNITY): Admitting: Certified Registered Nurse Anesthetist

## 2024-05-10 ENCOUNTER — Encounter (HOSPITAL_COMMUNITY): Payer: Self-pay | Admitting: Emergency Medicine

## 2024-05-10 DIAGNOSIS — R58 Hemorrhage, not elsewhere classified: Secondary | ICD-10-CM | POA: Diagnosis not present

## 2024-05-10 DIAGNOSIS — T148XXA Other injury of unspecified body region, initial encounter: Secondary | ICD-10-CM | POA: Diagnosis present

## 2024-05-10 DIAGNOSIS — I10 Essential (primary) hypertension: Secondary | ICD-10-CM | POA: Insufficient documentation

## 2024-05-10 DIAGNOSIS — Z79899 Other long term (current) drug therapy: Secondary | ICD-10-CM | POA: Diagnosis not present

## 2024-05-10 DIAGNOSIS — R109 Unspecified abdominal pain: Secondary | ICD-10-CM | POA: Diagnosis present

## 2024-05-10 DIAGNOSIS — E119 Type 2 diabetes mellitus without complications: Secondary | ICD-10-CM | POA: Diagnosis not present

## 2024-05-10 DIAGNOSIS — X58XXXA Exposure to other specified factors, initial encounter: Secondary | ICD-10-CM | POA: Diagnosis not present

## 2024-05-10 DIAGNOSIS — I129 Hypertensive chronic kidney disease with stage 1 through stage 4 chronic kidney disease, or unspecified chronic kidney disease: Secondary | ICD-10-CM | POA: Diagnosis not present

## 2024-05-10 DIAGNOSIS — Z743 Need for continuous supervision: Secondary | ICD-10-CM | POA: Diagnosis not present

## 2024-05-10 DIAGNOSIS — N182 Chronic kidney disease, stage 2 (mild): Secondary | ICD-10-CM

## 2024-05-10 DIAGNOSIS — Z7982 Long term (current) use of aspirin: Secondary | ICD-10-CM | POA: Insufficient documentation

## 2024-05-10 DIAGNOSIS — E1122 Type 2 diabetes mellitus with diabetic chronic kidney disease: Secondary | ICD-10-CM

## 2024-05-10 DIAGNOSIS — S301XXA Contusion of abdominal wall, initial encounter: Secondary | ICD-10-CM

## 2024-05-10 HISTORY — PX: HEMATOMA EVACUATION: SHX5118

## 2024-05-10 LAB — POCT I-STAT EG7
Acid-base deficit: 2 mmol/L (ref 0.0–2.0)
Bicarbonate: 23.5 mmol/L (ref 20.0–28.0)
Calcium, Ion: 1.18 mmol/L (ref 1.15–1.40)
HCT: 23 % — ABNORMAL LOW (ref 36.0–46.0)
Hemoglobin: 7.8 g/dL — ABNORMAL LOW (ref 12.0–15.0)
O2 Saturation: 93 %
Potassium: 4.1 mmol/L (ref 3.5–5.1)
Sodium: 135 mmol/L (ref 135–145)
TCO2: 25 mmol/L (ref 22–32)
pCO2, Ven: 43.3 mmHg — ABNORMAL LOW (ref 44–60)
pH, Ven: 7.343 (ref 7.25–7.43)
pO2, Ven: 72 mmHg — ABNORMAL HIGH (ref 32–45)

## 2024-05-10 LAB — HEMOGLOBIN A1C
Hgb A1c MFr Bld: 6.6 % — ABNORMAL HIGH (ref 4.8–5.6)
Mean Plasma Glucose: 143 mg/dL

## 2024-05-10 LAB — COMPREHENSIVE METABOLIC PANEL WITH GFR
ALT: 17 U/L (ref 0–44)
AST: 22 U/L (ref 15–41)
Albumin: 3.6 g/dL (ref 3.5–5.0)
Alkaline Phosphatase: 50 U/L (ref 38–126)
Anion gap: 12 (ref 5–15)
BUN: 21 mg/dL — ABNORMAL HIGH (ref 6–20)
CO2: 21 mmol/L — ABNORMAL LOW (ref 22–32)
Calcium: 8.7 mg/dL — ABNORMAL LOW (ref 8.9–10.3)
Chloride: 100 mmol/L (ref 98–111)
Creatinine, Ser: 0.8 mg/dL (ref 0.44–1.00)
GFR, Estimated: >60 mL/min (ref >60)
Glucose, Bld: 364 mg/dL — ABNORMAL HIGH (ref 70–99)
Potassium: 4.4 mmol/L (ref 3.5–5.1)
Sodium: 133 mmol/L — ABNORMAL LOW (ref 135–145)
Total Bilirubin: 0.8 mg/dL (ref 0.0–1.2)
Total Protein: 6.8 g/dL (ref 6.5–8.1)

## 2024-05-10 LAB — GLUCOSE, CAPILLARY
Glucose-Capillary: 321 mg/dL — ABNORMAL HIGH (ref 70–99)
Glucose-Capillary: 285 mg/dL — ABNORMAL HIGH (ref 70–99)
Glucose-Capillary: 320 mg/dL — ABNORMAL HIGH (ref 70–99)
Glucose-Capillary: 269 mg/dL — ABNORMAL HIGH (ref 70–99)
Glucose-Capillary: 224 mg/dL — ABNORMAL HIGH (ref 70–99)
Glucose-Capillary: 167 mg/dL — ABNORMAL HIGH (ref 70–99)
Glucose-Capillary: 168 mg/dL — ABNORMAL HIGH (ref 70–99)

## 2024-05-10 LAB — CBC
HCT: 23.7 % — ABNORMAL LOW (ref 36.0–46.0)
Hemoglobin: 7.8 g/dL — ABNORMAL LOW (ref 12.0–15.0)
MCH: 28.9 pg (ref 26.0–34.0)
MCHC: 32.9 g/dL (ref 30.0–36.0)
MCV: 87.8 fL (ref 80.0–100.0)
Platelets: 284 K/uL (ref 150–400)
RBC: 2.7 MIL/uL — ABNORMAL LOW (ref 3.87–5.11)
RDW: 13.5 % (ref 11.5–15.5)
WBC: 13.2 K/uL — ABNORMAL HIGH (ref 4.0–10.5)
nRBC: 0 % (ref 0.0–0.2)

## 2024-05-10 LAB — CBC WITH DIFFERENTIAL/PLATELET
Abs Immature Granulocytes: 0.17 K/uL — ABNORMAL HIGH (ref 0.00–0.07)
Basophils Absolute: 0 K/uL (ref 0.0–0.1)
Basophils Relative: 0 %
Eosinophils Absolute: 0 K/uL (ref 0.0–0.5)
Eosinophils Relative: 0 %
HCT: 32.3 % — ABNORMAL LOW (ref 36.0–46.0)
Hemoglobin: 10.5 g/dL — ABNORMAL LOW (ref 12.0–15.0)
Immature Granulocytes: 1 %
Lymphocytes Relative: 6 %
Lymphs Abs: 1.4 K/uL (ref 0.7–4.0)
MCH: 28.8 pg (ref 26.0–34.0)
MCHC: 32.5 g/dL (ref 30.0–36.0)
MCV: 88.7 fL (ref 80.0–100.0)
Monocytes Absolute: 1.6 K/uL — ABNORMAL HIGH (ref 0.1–1.0)
Monocytes Relative: 7 %
Neutro Abs: 19.6 K/uL — ABNORMAL HIGH (ref 1.7–7.7)
Neutrophils Relative %: 86 %
Platelets: 342 K/uL (ref 150–400)
RBC: 3.64 MIL/uL — ABNORMAL LOW (ref 3.87–5.11)
RDW: 13.4 % (ref 11.5–15.5)
WBC: 22.8 K/uL — ABNORMAL HIGH (ref 4.0–10.5)
nRBC: 0 % (ref 0.0–0.2)

## 2024-05-10 LAB — HEMOGLOBIN AND HEMATOCRIT, BLOOD
HCT: 22.6 % — ABNORMAL LOW (ref 36.0–46.0)
HCT: 22.9 % — ABNORMAL LOW (ref 36.0–46.0)
Hemoglobin: 7.3 g/dL — ABNORMAL LOW (ref 12.0–15.0)
Hemoglobin: 7.5 g/dL — ABNORMAL LOW (ref 12.0–15.0)

## 2024-05-10 LAB — I-STAT CG4 LACTIC ACID, ED
Lactic Acid, Venous: 2.6 mmol/L (ref 0.5–1.9)
Lactic Acid, Venous: 3.2 mmol/L (ref 0.5–1.9)

## 2024-05-10 LAB — PREPARE RBC (CROSSMATCH)

## 2024-05-10 LAB — HCG, QUANTITATIVE, PREGNANCY: hCG, Beta Chain, Quant, S: <1 m[IU]/mL (ref <5)

## 2024-05-10 SURGERY — EVACUATION HEMATOMA
Anesthesia: General | Site: Abdomen

## 2024-05-10 MED ORDER — 0.9 % SODIUM CHLORIDE (POUR BTL) OPTIME
TOPICAL | Status: DC | PRN
  Administered 2024-05-10: 1000 mL

## 2024-05-10 MED ORDER — LACTATED RINGERS IV BOLUS
1000.0000 mL | Freq: Once | INTRAVENOUS | Status: AC
  Administered 2024-05-10: 1000 mL via INTRAVENOUS

## 2024-05-10 MED ORDER — DIPHENHYDRAMINE HCL 50 MG/ML IJ SOLN
INTRAMUSCULAR | Status: AC
  Filled 2024-05-10: qty 1

## 2024-05-10 MED ORDER — PHENYLEPHRINE 80 MCG/ML (10ML) SYRINGE FOR IV PUSH (FOR BLOOD PRESSURE SUPPORT)
PREFILLED_SYRINGE | INTRAVENOUS | Status: AC
  Filled 2024-05-10: qty 10

## 2024-05-10 MED ORDER — ACETAMINOPHEN 650 MG RE SUPP: 650.0000 mg | Freq: Four times a day (QID) | RECTAL | Status: DC | PRN

## 2024-05-10 MED ORDER — FENTANYL CITRATE (PF) 250 MCG/5ML IJ SOLN
INTRAMUSCULAR | Status: AC
  Filled 2024-05-10: qty 5

## 2024-05-10 MED ORDER — MORPHINE SULFATE (PF) 4 MG/ML IV SOLN
6.0000 mg | Freq: Once | INTRAVENOUS | Status: AC
  Administered 2024-05-10: 6 mg via INTRAVENOUS
  Filled 2024-05-10: qty 2

## 2024-05-10 MED ORDER — SUGAMMADEX SODIUM 200 MG/2ML IV SOLN
INTRAVENOUS | Status: DC | PRN
  Administered 2024-05-10: 300 mg via INTRAVENOUS

## 2024-05-10 MED ORDER — PROPOFOL 10 MG/ML IV BOLUS
INTRAVENOUS | Status: DC | PRN
  Administered 2024-05-10: 150 mg via INTRAVENOUS

## 2024-05-10 MED ORDER — ONDANSETRON HCL 4 MG/2ML IJ SOLN
INTRAMUSCULAR | Status: DC | PRN
  Administered 2024-05-10: 4 mg via INTRAVENOUS

## 2024-05-10 MED ORDER — VANCOMYCIN HCL IN DEXTROSE 1-5 GM/200ML-% IV SOLN
1000.0000 mg | Freq: Once | INTRAVENOUS | Status: AC
  Administered 2024-05-10: 1000 mg via INTRAVENOUS
  Filled 2024-05-10: qty 200

## 2024-05-10 MED ORDER — ACETAMINOPHEN 325 MG PO TABS: 650.0000 mg | ORAL_TABLET | Freq: Four times a day (QID) | ORAL | Status: DC | PRN

## 2024-05-10 MED ORDER — ONDANSETRON HCL 4 MG/2ML IJ SOLN
INTRAMUSCULAR | Status: AC
  Filled 2024-05-10: qty 2

## 2024-05-10 MED ORDER — LACTATED RINGERS IV SOLN: INTRAVENOUS | Status: AC

## 2024-05-10 MED ORDER — ALBUMIN HUMAN 5 % IV SOLN
INTRAVENOUS | Status: DC | PRN
Start: 2024-05-10 — End: 2024-05-10

## 2024-05-10 MED ORDER — ROCURONIUM BROMIDE 10 MG/ML (PF) SYRINGE
PREFILLED_SYRINGE | INTRAVENOUS | Status: AC
Start: 2024-05-10 — End: 2024-05-10
  Filled 2024-05-10: qty 10

## 2024-05-10 MED ORDER — SODIUM CHLORIDE 0.9 % IV SOLN: INTRAVENOUS | Status: AC

## 2024-05-10 MED ORDER — HYDROCODONE-ACETAMINOPHEN 5-325 MG PO TABS
1.0000 | ORAL_TABLET | ORAL | Status: DC | PRN
  Administered 2024-05-10: 1 via ORAL
  Administered 2024-05-10 (×2): 2 via ORAL
  Administered 2024-05-11: 1 via ORAL
  Administered 2024-05-11: 2 via ORAL
  Filled 2024-05-10: qty 2
  Filled 2024-05-10: qty 1
  Filled 2024-05-10 (×3): qty 2
  Filled 2024-05-10: qty 1

## 2024-05-10 MED ORDER — LIDOCAINE 2% (20 MG/ML) 5 ML SYRINGE
INTRAMUSCULAR | Status: AC
Start: 2024-05-10 — End: 2024-05-10
  Filled 2024-05-10: qty 5

## 2024-05-10 MED ORDER — PROPOFOL 10 MG/ML IV BOLUS
INTRAVENOUS | Status: AC
  Filled 2024-05-10: qty 20

## 2024-05-10 MED ORDER — LIDOCAINE 2% (20 MG/ML) 5 ML SYRINGE
INTRAMUSCULAR | Status: DC | PRN
  Administered 2024-05-10: 80 mg via INTRAVENOUS

## 2024-05-10 MED ORDER — MIDAZOLAM HCL 2 MG/2ML IJ SOLN
INTRAMUSCULAR | Status: AC
  Filled 2024-05-10: qty 2

## 2024-05-10 MED ORDER — METRONIDAZOLE 500 MG/100ML IV SOLN
500.0000 mg | Freq: Once | INTRAVENOUS | Status: AC
  Administered 2024-05-10: 500 mg via INTRAVENOUS
  Filled 2024-05-10: qty 100

## 2024-05-10 MED ORDER — OXYCODONE HCL 5 MG/5ML PO SOLN: 5.0000 mg | Freq: Once | ORAL | Status: DC | PRN

## 2024-05-10 MED ORDER — SODIUM CHLORIDE 0.9 % IV SOLN
2.0000 g | Freq: Once | INTRAVENOUS | Status: AC
  Administered 2024-05-10: 2 g via INTRAVENOUS
  Filled 2024-05-10: qty 12.5

## 2024-05-10 MED ORDER — BUPIVACAINE-EPINEPHRINE (PF) 0.25% -1:200000 IJ SOLN
INTRAMUSCULAR | Status: AC
  Filled 2024-05-10: qty 30

## 2024-05-10 MED ORDER — ONDANSETRON HCL 4 MG/2ML IJ SOLN
4.0000 mg | Freq: Once | INTRAMUSCULAR | Status: AC
  Administered 2024-05-10: 4 mg via INTRAVENOUS
  Filled 2024-05-10: qty 2

## 2024-05-10 MED ORDER — IOHEXOL 300 MG/ML  SOLN
100.0000 mL | Freq: Once | INTRAMUSCULAR | Status: AC | PRN
  Administered 2024-05-10: 100 mL via INTRAVENOUS

## 2024-05-10 MED ORDER — HYDROMORPHONE HCL 1 MG/ML IJ SOLN: 0.2500 mg | INTRAMUSCULAR | Status: DC | PRN

## 2024-05-10 MED ORDER — STERILE WATER FOR IRRIGATION IR SOLN
Status: DC | PRN
  Administered 2024-05-10: 1000 mL

## 2024-05-10 MED ORDER — INSULIN ASPART 100 UNIT/ML IJ SOLN
0.0000 [IU] | Freq: Three times a day (TID) | INTRAMUSCULAR | Status: DC
  Administered 2024-05-10: 5 [IU] via SUBCUTANEOUS
  Administered 2024-05-10 – 2024-05-11 (×2): 3 [IU] via SUBCUTANEOUS

## 2024-05-10 MED ORDER — ACETAMINOPHEN 10 MG/ML IV SOLN
INTRAVENOUS | Status: DC | PRN
  Administered 2024-05-10: 1000 mg via INTRAVENOUS

## 2024-05-10 MED ORDER — ROCURONIUM BROMIDE 10 MG/ML (PF) SYRINGE
PREFILLED_SYRINGE | INTRAVENOUS | Status: DC | PRN
  Administered 2024-05-10: 50 mg via INTRAVENOUS
  Administered 2024-05-10: 20 mg via INTRAVENOUS

## 2024-05-10 MED ORDER — BUPIVACAINE-EPINEPHRINE 0.25% -1:200000 IJ SOLN
INTRAMUSCULAR | Status: DC | PRN
  Administered 2024-05-10: 30 mL

## 2024-05-10 MED ORDER — PHENYLEPHRINE HCL-NACL 20-0.9 MG/250ML-% IV SOLN
INTRAVENOUS | Status: DC | PRN
  Administered 2024-05-10: 40 ug/min via INTRAVENOUS

## 2024-05-10 MED ORDER — SUGAMMADEX SODIUM 200 MG/2ML IV SOLN
INTRAVENOUS | Status: AC
Start: 2024-05-10 — End: 2024-05-10
  Filled 2024-05-10: qty 4

## 2024-05-10 MED ORDER — DROPERIDOL 2.5 MG/ML IJ SOLN: 0.6250 mg | Freq: Once | INTRAMUSCULAR | Status: DC | PRN

## 2024-05-10 MED ORDER — DIPHENHYDRAMINE HCL 50 MG/ML IJ SOLN
INTRAMUSCULAR | Status: DC | PRN
  Administered 2024-05-10: 12.5 mg via INTRAVENOUS

## 2024-05-10 MED ORDER — INSULIN ASPART 100 UNIT/ML IJ SOLN
INTRAMUSCULAR | Status: DC | PRN
  Administered 2024-05-10: 10 [IU] via SUBCUTANEOUS

## 2024-05-10 MED ORDER — OXYCODONE HCL 5 MG PO TABS: 5.0000 mg | ORAL_TABLET | Freq: Once | ORAL | Status: DC | PRN

## 2024-05-10 MED ORDER — MORPHINE SULFATE (PF) 4 MG/ML IV SOLN
6.0000 mg | INTRAVENOUS | Status: DC | PRN
  Administered 2024-05-10: 6 mg via INTRAVENOUS
  Filled 2024-05-10: qty 2

## 2024-05-10 MED ORDER — FENTANYL CITRATE (PF) 250 MCG/5ML IJ SOLN
INTRAMUSCULAR | Status: DC | PRN
  Administered 2024-05-10: 50 ug via INTRAVENOUS
  Administered 2024-05-10: 100 ug via INTRAVENOUS

## 2024-05-10 MED ORDER — PHENYLEPHRINE 80 MCG/ML (10ML) SYRINGE FOR IV PUSH (FOR BLOOD PRESSURE SUPPORT)
PREFILLED_SYRINGE | INTRAVENOUS | Status: DC | PRN
  Administered 2024-05-10: 160 ug via INTRAVENOUS
  Administered 2024-05-10 (×3): 80 ug via INTRAVENOUS

## 2024-05-10 MED ORDER — MIDAZOLAM HCL 2 MG/2ML IJ SOLN
INTRAMUSCULAR | Status: DC | PRN
  Administered 2024-05-10: 2 mg via INTRAVENOUS

## 2024-05-10 SURGICAL SUPPLY — 32 items
BAG COUNTER SPONGE SURGICOUNT (BAG) ×1 IMPLANT
BIOPATCH RED 1 DISK 7.0 (GAUZE/BANDAGES/DRESSINGS) IMPLANT
BLADE SURG 15 STRL LF DISP TIS (BLADE) ×1 IMPLANT
CANISTER SUCT 1200ML W/VALVE (MISCELLANEOUS) ×1 IMPLANT
CHLORAPREP W/TINT 26 (MISCELLANEOUS) IMPLANT
COVER SURGICAL LIGHT HANDLE (MISCELLANEOUS) ×1 IMPLANT
DRAIN CHANNEL 19F RND (DRAIN) IMPLANT
ELECT CAUTERY BLADE 6.4 (BLADE) ×1 IMPLANT
ELECTRODE REM PT RTRN 9FT ADLT (ELECTROSURGICAL) ×1 IMPLANT
EVACUATOR SILICONE 100CC (DRAIN) IMPLANT
GAUZE PAD ABD 8X10 STRL (GAUZE/BANDAGES/DRESSINGS) IMPLANT
GAUZE SPONGE 4X4 12PLY STRL (GAUZE/BANDAGES/DRESSINGS) ×1 IMPLANT
GLOVE BIOGEL M STRL SZ7.5 (GLOVE) ×2 IMPLANT
GLOVE BIOGEL PI IND STRL 8 (GLOVE) ×1 IMPLANT
GLOVE BIOGEL PI MICRO STRL 7 (GLOVE) IMPLANT
GOWN STRL REUS W/ TWL LRG LVL3 (GOWN DISPOSABLE) ×2 IMPLANT
KIT BASIN OR (CUSTOM PROCEDURE TRAY) ×1 IMPLANT
KIT TURNOVER KIT B (KITS) ×1 IMPLANT
NDL HYPO 25GX1X1/2 BEV (NEEDLE) IMPLANT
NEEDLE HYPO 25GX1X1/2 BEV (NEEDLE) ×1 IMPLANT
NS IRRIG 1000ML POUR BTL (IV SOLUTION) ×1 IMPLANT
PACK GENERAL/GYN (CUSTOM PROCEDURE TRAY) ×1 IMPLANT
SOL PREP POV-IOD 4OZ 10% (MISCELLANEOUS) ×1 IMPLANT
SPONGE T-LAP 18X18 ~~LOC~~+RFID (SPONGE) IMPLANT
STAPLER INSORB 30 2030 C-SECTI (MISCELLANEOUS) IMPLANT
STAPLER VISISTAT (STAPLE) IMPLANT
SUT ETHILON 3 0 PS 1 (SUTURE) IMPLANT
SUT VIC AB 2-0 CT1 TAPERPNT 27 (SUTURE) IMPLANT
SUTURE STRATFX MNCRL+ 3-0 PS-2 (SUTURE) IMPLANT
SYR CONTROL 10ML LL (SYRINGE) ×1 IMPLANT
TOWEL GREEN STERILE (TOWEL DISPOSABLE) ×1 IMPLANT
TOWEL GREEN STERILE FF (TOWEL DISPOSABLE) ×1 IMPLANT

## 2024-05-10 NOTE — Inpatient Diabetes Management (Signed)
 Inpatient Diabetes Program Recommendations  AACE/ADA: New Consensus Statement on Inpatient Glycemic Control   Target Ranges:  Prepandial:   less than 140 mg/dL      Peak postprandial:   less than 180 mg/dL (1-2 hours)      Critically ill patients:  140 - 180 mg/dL    Latest Reference Range & Units 05/10/24 05:58 05/10/24 06:46 05/10/24 07:22 05/10/24 08:04  Glucose-Capillary 70 - 99 mg/dL 678 (H) 679 (H) 714 (H) 269 (H)   Review of Glycemic Control  Diabetes history: DM2 Outpatient Diabetes medications: Mounjaro 7.5 mg Qweek, Toujeo 60 units daily Current orders for Inpatient glycemic control: Novolog  0-15 units TID with meals  Inpatient Diabetes Program Recommendations:    Insulin : Please consider ordering Semglee 20 units Q24H.  Thanks, Earnie Gainer, RN, MSN, CDCES Diabetes Coordinator Inpatient Diabetes Program [phone] (Team Pager from 8am to 5pm)

## 2024-05-10 NOTE — Anesthesia Preprocedure Evaluation (Addendum)
 Anesthesia Evaluation  Patient identified by MRN, date of birth, ID band Patient awake    Reviewed: Allergy & Precautions, H&P , NPO status , Patient's Chart, lab work & pertinent test results  Airway Mallampati: III  TM Distance: >3 FB Neck ROM: Full    Dental  (+) Dental Advisory Given, Teeth Intact   Pulmonary sleep apnea    Pulmonary exam normal breath sounds clear to auscultation       Cardiovascular hypertension, Pt. on medications + Valvular Problems/Murmurs  Rhythm:Regular Rate:Tachycardia + Systolic murmurs    Neuro/Psych negative neurological ROS  negative psych ROS   GI/Hepatic Neg liver ROS, hiatal hernia,,,  Endo/Other  diabetes, Type 2    Renal/GU Renal disease  negative genitourinary   Musculoskeletal negative musculoskeletal ROS (+)    Abdominal  (+) + obese  Peds  Hematology negative hematology ROS (+)   Anesthesia Other Findings   Reproductive/Obstetrics negative OB ROS                              Anesthesia Physical Anesthesia Plan  ASA: 3 and emergent  Anesthesia Plan: General   Post-op Pain Management: Ofirmev  IV (intra-op)*   Induction: Intravenous  PONV Risk Score and Plan: 4 or greater and Ondansetron , Dexamethasone , Treatment may vary due to age or medical condition and Midazolam   Airway Management Planned: Oral ETT  Additional Equipment:   Intra-op Plan:   Post-operative Plan: Extubation in OR  Informed Consent: I have reviewed the patients History and Physical, chart, labs and discussed the procedure including the risks, benefits and alternatives for the proposed anesthesia with the patient or authorized representative who has indicated his/her understanding and acceptance.     Dental advisory given  Plan Discussed with: CRNA  Anesthesia Plan Comments: (+/- glidescope. )         Anesthesia Quick Evaluation

## 2024-05-10 NOTE — Interval H&P Note (Signed)
 History and Physical Interval Note:  05/10/2024 6:01 AM  Annette Khan  has presented today for surgery, with the diagnosis of abdominal hematoma.  The various methods of treatment have been discussed with the patient and family. After consideration of risks, benefits and other options for treatment, the patient has consented to  Procedure(s) with comments: EVACUATION HEMATOMA (N/A) - abdominal evacuation of hematoma as a surgical intervention.  The patient's history has been reviewed, patient examined, no change in status, stable for surgery.  I have reviewed the patient's chart and labs.  Questions were answered to the patient's satisfaction.     Elisabeth GORMAN Coombs

## 2024-05-10 NOTE — Op Note (Signed)
 Operative Note   DATE OF OPERATION: 05/10/2024  SURGICAL DEPARTMENT: Plastic Surgery  LOCATION: [facility]  PREOPERATIVE DIAGNOSES: Abdominal hematoma  POSTOPERATIVE DIAGNOSES:  same  PROCEDURE: Evacuation abdominal wall hematoma  SURGEON: Craig Lorren Shank, MD  ASSISTANT: None  ANESTHESIA:  General.   COMPLICATIONS: None.   INDICATIONS FOR PROCEDURE:  The patient, Annette Khan is a 48 y.o. female born on 1975-11-08, is here for treatment of abdominal wall hematoma MRN: [id number]  CONSENT:  Informed consent was obtained directly from the patient. Risks, benefits and alternatives were fully discussed. Specific risks including but not limited to bleeding, infection, hematoma, seroma, scarring, pain, contracture, asymmetry, wound healing problems, and need for further surgery were all discussed. The patient did have an ample opportunity to have questions answered to satisfaction.   DESCRIPTION OF PROCEDURE:  The patient was taken to the operating room. SCDs were placed and antibiotics were given.  General anesthesia was administered.  The patient's operative site was prepped and draped in a sterile fashion. A time out was performed and all information was confirmed to be correct.  I started by opening the incision with a 10 blade.  It was opened all the way across.  There was about 150 cc hematoma on the left lateral aspect consistent with the CT scan.  Irrigation was performed.  There was a couple of small bleeding points 1 of which was right in the area of the hematoma.  After all the bleeding had been stopped with cautery I irrigated again.  We looked for bleeding for a while to ensure none was present.  The previous drain had been removed and a new 19 French drain was placed and secured with a nylon suture.  The bed was then flexed to take tension off and closure was performed.  The skin was stapled temporarily for orientation and deep 2-0 Vicryl sutures were used to  close the Scarpa's fascia down to the deep fascia which was followed by an INSORB stapler and a running 3 oh STRATAFIX subcuticular.  ABD pads and a abdominal binder were then applied.  The patient tolerated the procedure well.  There were no complications. The patient was allowed to wake from anesthesia, extubated and taken to the recovery room in satisfactory condition.

## 2024-05-10 NOTE — Transfer of Care (Signed)
 Immediate Anesthesia Transfer of Care Note  Patient: Annette Khan  Procedure(s) Performed: EVACUATION ABDOMIONAL HEMATOMA (Abdomen)  Patient Location: PACU  Anesthesia Type:General  Level of Consciousness: awake, alert , and oriented  Airway & Oxygen Therapy: Patient Spontanous Breathing  Post-op Assessment: Report given to RN and Post -op Vital signs reviewed and stable  Post vital signs: Reviewed and stable  Last Vitals:  Vitals Value Taken Time  BP 159/90 05/10/24 08:00  Temp    Pulse 122 05/10/24 08:04  Resp 15 05/10/24 08:04  SpO2 95 % 05/10/24 08:04  Vitals shown include unfiled device data.  Last Pain:  Vitals:   05/10/24 0512  TempSrc:   PainSc: 2          Complications: There were no known notable events for this encounter.

## 2024-05-10 NOTE — Anesthesia Procedure Notes (Signed)
 Procedure Name: Intubation Date/Time: 05/10/2024 6:30 AM  Performed by: Roddie Grate, CRNAPre-anesthesia Checklist: Patient identified, Emergency Drugs available, Suction available, Patient being monitored and Timeout performed Patient Re-evaluated:Patient Re-evaluated prior to induction Oxygen Delivery Method: Circle system utilized Preoxygenation: Pre-oxygenation with 100% oxygen Induction Type: IV induction Ventilation: Mask ventilation without difficulty Laryngoscope Size: Mac and 3 Grade View: Grade II Tube type: Oral Tube size: 7.0 mm Number of attempts: 1 Airway Equipment and Method: Stylet Placement Confirmation: ETT inserted through vocal cords under direct vision, positive ETCO2 and breath sounds checked- equal and bilateral Secured at: 22 cm Tube secured with: Tape Dental Injury: Teeth and Oropharynx as per pre-operative assessment  Comments: Smooth IV Induction. Eyes taped. Easy mask. DL x 1 with grade 2b view. Atraumatically placed, teeth and lip remain intact as pre-op. Secured with tape. Bilateral breath sounds +/=, EtCO2 +, Adequate TV, VSS.

## 2024-05-10 NOTE — ED Provider Notes (Signed)
 [place] EMERGENCY DEPARTMENT AT [facility] Provider Note   CSN: [id number] Arrival date & time: 05/10/24  0008     History Chief Complaint  Patient presents with   Post-op Problem    HPI Annette Khan is a 48 y.o. female presenting for chief complaint of postoperative bleeding.  48 year old female who had a cosmetic procedure done earlier in the day with [facility] plastic surgery. Was doing well until she had large-volume abdominal bleeding start including drainage from her surgical site. Denies fevers chills nausea vomiting syncope shortness of breath..   Patient's recorded medical, surgical, social, medication list and allergies were reviewed in the Snapshot window as part of the initial history.   Review of Systems   Review of Systems  Constitutional:  Negative for chills and fever.  HENT:  Negative for ear pain and sore throat.   Eyes:  Negative for pain and visual disturbance.  Respiratory:  Negative for cough and shortness of breath.   Cardiovascular:  Negative for chest pain and palpitations.  Gastrointestinal:  Positive for abdominal distention and abdominal pain. Negative for vomiting.  Genitourinary:  Negative for dysuria and hematuria.  Musculoskeletal:  Negative for arthralgias and back pain.  Skin:  Negative for color change and rash.  Neurological:  Negative for seizures and syncope.  All other systems reviewed and are negative.   Physical Exam Updated Vital Signs BP (!) 142/75 (BP Location: Right Arm)   Pulse (!) 113   Temp 98.8 F (37.1 C) (Oral)   Resp 20   Wt 94.3 kg   SpO2 99%   BMI 36.25 kg/m  Physical Exam Vitals and nursing note reviewed.  Constitutional:      General: She is not in acute distress.    Appearance: She is well-developed.  HENT:     Head: Normocephalic and atraumatic.  Eyes:     Conjunctiva/sclera: Conjunctivae normal.  Cardiovascular:     Rate and Rhythm: Normal rate and regular rhythm.     Heart sounds:  No murmur heard. Pulmonary:     Effort: Pulmonary effort is normal. No respiratory distress.     Breath sounds: Normal breath sounds.  Abdominal:     General: There is no distension.     Palpations: Abdomen is soft.     Tenderness: There is abdominal tenderness. There is no right CVA tenderness or left CVA tenderness.  Musculoskeletal:        General: No swelling or tenderness. Normal range of motion.     Cervical back: Neck supple.  Skin:    General: Skin is warm and dry.  Neurological:     General: No focal deficit present.     Mental Status: She is alert and oriented to person, place, and time. Mental status is at baseline.     Cranial Nerves: No cranial nerve deficit.      ED Course/ Medical Decision Making/ A&P    Procedures .Critical Care  Performed by: Jerral Meth, MD Authorized by: Jerral Meth, MD   Critical care provider statement:    Critical care time (minutes):  30   Critical care was necessary to treat or prevent imminent or life-threatening deterioration of the following conditions:  Sepsis   Critical care was time spent personally by me on the following activities:  Development of treatment plan with patient or surrogate, discussions with consultants, evaluation of patient's response to treatment, examination of patient, ordering and review of laboratory studies, ordering and review of radiographic studies, ordering and  performing treatments and interventions, pulse oximetry, re-evaluation of patient's condition and review of old charts    Medications Ordered in ED Medications  lactated ringers  infusion (has no administration in time range)  metroNIDAZOLE  (FLAGYL ) IVPB 500 mg (has no administration in time range)  vancomycin  (VANCOCIN ) IVPB 1000 mg/200 mL premix (has no administration in time range)  morphine  (PF) 4 MG/ML injection 6 mg (has no administration in time range)  morphine  (PF) 4 MG/ML injection 6 mg (6 mg Intravenous Given 05/10/24 0149)   lactated ringers  bolus 1,000 mL (1,000 mLs Intravenous New Bag/Given 05/10/24 0152)  ondansetron  (ZOFRAN ) injection 4 mg (4 mg Intravenous Given 05/10/24 0145)  ceFEPIme  (MAXIPIME ) 2 g in sodium chloride  0.9 % 100 mL IVPB (0 g Intravenous Stopped 05/10/24 0324)  iohexol  (OMNIPAQUE ) 300 MG/ML solution 100 mL (100 mLs Intravenous Contrast Given 05/10/24 0341)   Medical Decision Making:   [name] is a 48 y.o. female who presented to the ED today with abdominal pain, detailed above.    Patient placed on continuous vitals and telemetry monitoring while in ED which was reviewed periodically.  Complete initial physical exam performed, notably the patient  was HDS in NAD.     Reviewed and confirmed nursing documentation for past medical history, family history, social history.    Initial Assessment:   With the patient's presentation of abdominal pain, most likely diagnosis is procedural complication. Other diagnoses were considered including (but not limited to) gastroenteritis, colitis, small bowel obstruction, appendicitis, cholecystitis, pancreatitis, nephrolithiasis, UTI, pyleonephritis These are considered less likely due to history of present illness and physical exam findings.   This is most consistent with an acute life/limb threatening illness complicated by underlying chronic conditions.   Initial Plan:  CBC/CMP to evaluate for underlying infectious/metabolic etiology for patient's abdominal pain  Lipase to evaluate for pancreatitis  EKG to evaluate for cardiac source of pain  CTAB/Pelvis with contrast to evaluate for structural/surgical etiology of patients' severe abdominal pain.  Urinalysis and repeat physical assessment to evaluate for UTI/Pyelonpehritis  Empiric management of symptoms with escalating pain control and antiemetics as needed.   Initial Study Results:   Laboratory  All laboratory results reviewed without evidence of clinically relevant pathology.   Exceptions  include: Leukocytosis to 22, hemoglobin from 14-10, lactic acid positive   KG EKG was reviewed independently. Rate, rhythm, axis, intervals all examined and without medically relevant abnormality. ST segments without concerns for elevations.    Radiology All images reviewed independently. Agree with radiology report at this time.   CT ABDOMEN PELVIS W CONTRAST Result Date: 05/10/2024 EXAM: CT ABDOMEN AND PELVIS WITH CONTRAST 05/10/2024 04:05:35 AM TECHNIQUE: CT of the abdomen and pelvis was performed with the administration of intravenous contrast. Multiplanar reformatted images are provided for review. Automated exposure control, iterative reconstruction, and/or weight based adjustment of the mA/kV was utilized to reduce the radiation dose to as low as reasonably achievable. COMPARISON: CT abdomen and pelvis with contrast 12/21/22. CLINICAL HISTORY: Peritonitis or perforation suspected. Bleeding from post-op site - patient states she had a tummy tuck and lipo suction at 11:30am this morning. Patient has abdominal binder in place, blood saturating through left lower abdominal side. JP drain full with sanguinous content. Patient states she was straining to poop and thinks perhaps this triggered a stitch to pop. FINDINGS: LOWER CHEST: Minimal atelectasis is present at both lung bases. LIVER: The liver is unremarkable. GALLBLADDER AND BILE DUCTS: Gallbladder is unremarkable. No biliary ductal dilatation. SPLEEN: No acute abnormality. PANCREAS:  No acute abnormality. ADRENAL GLANDS: No acute abnormality. KIDNEYS, URETERS AND BLADDER: No stones in the kidneys or ureters. No hydronephrosis. No perinephric or periureteral stranding. Air is present in the urinary bladder, which may be secondary to urinary catheterization during the procedure. GI AND BOWEL: Stomach demonstrates no acute abnormality. There is no bowel obstruction. No bowel wall thickening. PERITONEUM AND RETROPERITONEUM: Extraperitoneal gas is present  over the anterior abdomen. A surgical drain is in place. VASCULATURE: Aorta is normal in caliber. LYMPH NODES: No lymphadenopathy. REPRODUCTIVE ORGANS: No acute abnormality. BONES AND SOFT TISSUES: A left lateral subcutaneous hematoma measures 13.4 x 5.7 x 5.3 cm. The volume of this collection is approximately 202.7 cc. IMPRESSION: 1. Left lateral subcutaneous hematoma measuring 13.4 x 5.7 x 5.3 cm (volume approximately 202.7 cc). 2. No evidence of pneumoperitoneum or acute intraabdominal process. Electronically signed by: Lonni Necessary MD 05/10/2024 04:23 AM EDT RP Workstation: [id number]     Consults: Case discussed with Dr. Elisabeth.   Final Reassessment and Plan:   Patient met criteria for activation of code sepsis given positive lactic, hemoglobin drop, leukocytosis, tachycardia. Treated with IV fluids/antibiotics pending stabilization. Surgical team requested ER to ER transfer to [facility] to facilitate operative evacuation of hematoma. Discussed with accepting team.  Anticipate in person consultation with specialist and operative intervention as disposition.  Clinical Impression:  1. Abdominal pain, unspecified abdominal location      Data Unavailable   Final Clinical Impression(s) / ED Diagnoses Final diagnoses:  Abdominal pain, unspecified abdominal location    Rx / DC Orders ED Discharge Orders     None         Jerral Meth, MD 05/10/24 0430

## 2024-05-10 NOTE — ED Provider Notes (Signed)
 Pt transferred from [facility] for plastics eval for postop hematoma.  Dr. Elisabeth updated.  Plan to take to OR for ongoing mgmt.      Griselda Norris, MD 05/10/24 [phone]

## 2024-05-10 NOTE — Sepsis Progress Note (Signed)
 Elink monitoring for the code sepsis protocol.

## 2024-05-10 NOTE — TOC CM/SW Note (Signed)
 Transition of Care [facility]) - Inpatient Brief Assessment   Patient Details  Name: Annette Khan MRN: [id number] Date of Birth: 16-Jun-1976  [facility] of [facility] CM/SW Contact:    Lauraine FORBES Saa, LCSW Phone Number: 05/10/2024, 1:09 PM   Clinical Narrative:  1:09 PM Per chart review, patient has a PCP and insurance. Patient does not have SNF/HH/DME history. Patient's preferred pharmacy is [facility] [phone] [facility]. Patient declined CSW offer of SDOH (transportation) resources and confirmed that she is successfully able to attend appointments. No TOC needs were identified at this time. TOC will continue to follow and be available to assist.  Transition of Care Asessment: Insurance and Status: Insurance coverage has been reviewed Patient has primary care physician: Yes Home environment has been reviewed: Private Residence Prior level of function:: N/A Prior/Current Home Services: No current home services Social Drivers of Health Review: SDOH reviewed no interventions necessary Readmission risk has been reviewed: Yes Transition of care needs: no transition of care needs at this time

## 2024-05-10 NOTE — H&P (Signed)
 Reason for Consult/CC: Hematoma  Annette Khan is an 48 y.o. female.  HPI: Patient presents with hematoma after abdominoplasty yesterday.  She was straining to have a bowel movement and noticed pain and swelling thereafter.  She is hemodynamically stable but has a hematoma in the wound on the skin which would benefit from evacuation.  Allergies:  Allergies  Allergen Reactions   Other Hives   Metformin And Related Diarrhea   Penicillins Hives   Prilosec [Omeprazole] Nausea And Vomiting    Medications:  Current Facility-Administered Medications:    lactated ringers  infusion, , Intravenous, Continuous, Countryman, Chase, MD   morphine  (PF) 4 MG/ML injection 6 mg, 6 mg, Intravenous, Q2H PRN, Countryman, Chase, MD, 6 mg at 05/10/24 0439  Current Outpatient Medications:    aspirin 81 MG tablet, Take 81 mg by mouth daily., Disp: , Rfl:    Biotin 89999 MCG TABS, Take by mouth daily., Disp: , Rfl:    buPROPion (WELLBUTRIN XL) 150 MG 24 hr tablet, Take 150 mg by mouth daily., Disp: , Rfl:    carvedilol (COREG) 6.25 MG tablet, Take 6.25 mg by mouth 2 (two) times daily., Disp: , Rfl:    Cholecalciferol (VITAMIN D3) 1000 units CAPS, Take 1,000 Units by mouth daily., Disp: , Rfl:    cloNIDine (CATAPRES) 0.1 MG tablet, Take 0.1 mg by mouth 2 (two) times daily., Disp: , Rfl:    estradiol (ESTRACE) 0.1 MG/GM vaginal cream, Place vaginally., Disp: , Rfl:    estradiol (VIVELLE-DOT) 0.025 MG/24HR, 1 patch 2 (two) times a week., Disp: , Rfl:    EVENING PRIMROSE OIL PO, Take by mouth. With black cohosh, Disp: , Rfl:    ibuprofen  (ADVIL ) 800 MG tablet, Take 1 tablet (800 mg total) by mouth every 8 (eight) hours as needed., Disp: 30 tablet, Rfl: 0   lisdexamfetamine (VYVANSE ) 30 MG capsule, Take 30 mg by mouth daily., Disp: , Rfl:    MAGNESIUM PO, Take 500 mg by mouth daily., Disp: , Rfl:    Multiple Vitamins-Minerals (MULTIVITAMIN ADULT PO), Take 1 tablet by mouth daily., Disp: , Rfl:    ondansetron   (ZOFRAN -ODT) 4 MG disintegrating tablet, Take 4 mg by mouth every 6 (six) hours as needed., Disp: , Rfl:    Potassium 99 MG TABS, Take 99 mg by mouth daily., Disp: , Rfl:    progesterone (PROMETRIUM) 100 MG capsule, Take 100 mg by mouth at bedtime., Disp: , Rfl:    rosuvastatin (CRESTOR) 10 MG tablet, Take 10 mg by mouth daily., Disp: , Rfl:    tirzepatide (MOUNJARO) 7.5 MG/0.5ML Pen, Inject 7.5 mg into the skin once a week., Disp: , Rfl:    TOUJEO SOLOSTAR 300 UNIT/ML Solostar Pen, Inject 60 Units into the skin daily at 6 (six) AM., Disp: , Rfl:    traMADol (ULTRAM) 50 MG tablet, Take 50 mg by mouth every 4 (four) hours as needed., Disp: , Rfl:    VALERIAN ROOT PO, Take 5,000 mg by mouth daily., Disp: , Rfl:    venlafaxine XR (EFFEXOR-XR) 150 MG 24 hr capsule, Take 1 capsule by mouth daily., Disp: , Rfl:    Wheat Dextrin (BENEFIBER DRINK MIX PO), Take 15 mLs by mouth as needed., Disp: , Rfl:    buPROPion (WELLBUTRIN XL) 150 MG 24 hr tablet, Take 1 tablet by mouth daily. (Patient not taking: Reported on 11/30/2023), Disp: , Rfl:    Calcium-Vitamin D -Vitamin K (CHEWABLE CALCIUM PO), Take 1 tablet by mouth daily. (Patient not taking: Reported on 11/30/2023),  Disp: , Rfl:    cetirizine (ZYRTEC) 10 MG tablet, Take 10 mg by mouth daily., Disp: , Rfl:    doxycycline (VIBRA-TABS) 100 MG tablet, Take 100 mg by mouth daily. (Patient not taking: Reported on 05/10/2024), Disp: , Rfl:    ENSKYCE 0.15-30 MG-MCG tablet, Take 1 tablet by mouth daily. (Patient not taking: Reported on 12/24/2022), Disp: , Rfl:    hyoscyamine (LEVSIN, ANASPAZ) 0.125 MG tablet, Take 1 tablet by mouth as needed. (Patient not taking: Reported on 11/30/2023), Disp: , Rfl:    lisinopril (PRINIVIL,ZESTRIL) 10 MG tablet, Take 1 tablet by mouth daily. (Patient not taking: Reported on 05/10/2024), Disp: , Rfl:    simvastatin (ZOCOR) 20 MG tablet, Take 10 mg by mouth at bedtime. (Patient not taking: Reported on 11/30/2023), Disp: , Rfl:   Past  Medical History:  Diagnosis Date   Diabetes (HCC)    Heart murmur    History of hiatal hernia    Hyperlipidemia    Hypertension    IBS (irritable bowel syndrome)    Kidney stone    Sleep apnea     Past Surgical History:  Procedure Laterality Date   COLONOSCOPY     DILITATION & CURRETTAGE/HYSTROSCOPY WITH HYDROTHERMAL ABLATION N/A 12/17/2020   Procedure: DILATATION & CURETTAGE/HYSTEROSCOPY WITH HYDROTHERMAL ABLATION, removal of IUD;  Surgeon: Rosalva Sawyer, MD;  Location: [facility];  Service: Gynecology;  Laterality: N/A;   LAPAROSCOPIC TUBAL LIGATION Bilateral 12/17/2020   Procedure: LAPAROSCOPIC BILATERAL TUBAL LIGATION;  Surgeon: Rosalva Sawyer, MD;  Location: [facility];  Service: Gynecology;  Laterality: Bilateral;   SPHINCTEROTOMY     at age 55    Family History  Problem Relation Age of Onset   Diabetes Mother    Diverticulitis Mother    Irritable bowel syndrome Mother    Dementia Mother    Stroke Mother    Transient ischemic attack Mother    Diabetes Father    Heart attack Father    Irritable bowel syndrome Father    Diverticulitis Father    Hypertension Brother    Heart attack Maternal Grandfather    Colon cancer Neg Hx    Esophageal cancer Neg Hx    Rectal cancer Neg Hx    Stomach cancer Neg Hx     Social History:  reports that she has never smoked. She has been exposed to tobacco smoke. She has never used smokeless tobacco. She reports current alcohol use. She reports that she does not use drugs.  Physical Exam Blood pressure (!) 142/75, pulse (!) 113, temperature 98.8 F (37.1 C), temperature source Oral, resp. rate 20, weight 94.3 kg, SpO2 99%. General: Appropriately swollen and tender on exam.  Some blood on her dressing but her drain is not actively filling up.  The pain is focused centrally and on the left side.  All of her incisions are intact.  Results for orders placed or performed during the hospital encounter of 05/10/24  (from the past 48 hours)  CBC with Differential     Status: Abnormal   Collection Time: 05/10/24  1:58 AM  Result Value Ref Range   WBC 22.8 (H) 4.0 - 10.5 K/uL   RBC 3.64 (L) 3.87 - 5.11 MIL/uL   Hemoglobin 10.5 (L) 12.0 - 15.0 g/dL   HCT 67.6 (L) 63.9 - 53.9 %   MCV 88.7 80.0 - 100.0 fL   MCH 28.8 26.0 - 34.0 pg   MCHC 32.5 30.0 - 36.0 g/dL   RDW 13.4  11.5 - 15.5 %   Platelets 342 150 - 400 K/uL   nRBC 0.0 0.0 - 0.2 %   Neutrophils Relative % 86 %   Neutro Abs 19.6 (H) 1.7 - 7.7 K/uL   Lymphocytes Relative 6 %   Lymphs Abs 1.4 0.7 - 4.0 K/uL   Monocytes Relative 7 %   Monocytes Absolute 1.6 (H) 0.1 - 1.0 K/uL   Eosinophils Relative 0 %   Eosinophils Absolute 0.0 0.0 - 0.5 K/uL   Basophils Relative 0 %   Basophils Absolute 0.0 0.0 - 0.1 K/uL   Immature Granulocytes 1 %   Abs Immature Granulocytes 0.17 (H) 0.00 - 0.07 K/uL    Comment: Performed at [facility], [address], [place], [place] [address]  Comprehensive metabolic panel     Status: Abnormal   Collection Time: 05/10/24  1:58 AM  Result Value Ref Range   Sodium 133 (L) 135 - 145 mmol/L   Potassium 4.4 3.5 - 5.1 mmol/L   Chloride 100 98 - 111 mmol/L   CO2 21 (L) 22 - 32 mmol/L   Glucose, Bld 364 (H) 70 - 99 mg/dL    Comment: Glucose reference range applies only to samples taken after fasting for at least 8 hours.   BUN 21 (H) 6 - 20 mg/dL   Creatinine, Ser 9.19 0.44 - 1.00 mg/dL   Calcium 8.7 (L) 8.9 - 10.3 mg/dL   Total Protein 6.8 6.5 - 8.1 g/dL   Albumin  3.6 3.5 - 5.0 g/dL   AST 22 15 - 41 U/L   ALT 17 0 - 44 U/L   Alkaline Phosphatase 50 38 - 126 U/L   Total Bilirubin 0.8 0.0 - 1.2 mg/dL   GFR, Estimated >39 >39 mL/min    Comment: (NOTE) Calculated using the CKD-EPI Creatinine Equation (2021)    Anion gap 12 5 - 15    Comment: Performed at [facility], [address] [place], [place] [address]  hCG, quantitative, pregnancy     Status: None   Collection Time:  05/10/24  1:58 AM  Result Value Ref Range   hCG, Beta Chain, Quant, S <1 <5 mIU/mL    Comment:          GEST. AGE      CONC.  (mIU/mL)   <=1 WEEK        5 - 50     2 WEEKS       50 - 500     3 WEEKS       100 - 10,000     4 WEEKS     1,000 - 30,000     5 WEEKS     3,500 - 115,000   6-8 WEEKS     12,000 - 270,000    12 WEEKS     15,000 - 220,000        FEMALE AND NON-PREGNANT FEMALE:     LESS THAN 5 mIU/mL Performed at [facility], [address], [place], [place] [address]   I-Stat CG4 Lactic Acid     Status: Abnormal   Collection Time: 05/10/24  2:55 AM  Result Value Ref Range   Lactic Acid, Venous 2.6 (HH) 0.5 - 1.9 mmol/L   Comment NOTIFIED PHYSICIAN   I-Stat CG4 Lactic Acid     Status: Abnormal   Collection Time: 05/10/24  4:28 AM  Result Value Ref Range   Lactic Acid, Venous 3.2 (HH) 0.5 - 1.9 mmol/L  Comment NOTIFIED PHYSICIAN     CT ABDOMEN PELVIS W CONTRAST Result Date: 05/10/2024 EXAM: CT ABDOMEN AND PELVIS WITH CONTRAST 05/10/2024 04:05:35 AM TECHNIQUE: CT of the abdomen and pelvis was performed with the administration of intravenous contrast. Multiplanar reformatted images are provided for review. Automated exposure control, iterative reconstruction, and/or weight based adjustment of the mA/kV was utilized to reduce the radiation dose to as low as reasonably achievable. COMPARISON: CT abdomen and pelvis with contrast 12/21/22. CLINICAL HISTORY: Peritonitis or perforation suspected. Bleeding from post-op site - patient states she had a tummy tuck and lipo suction at 11:30am this morning. Patient has abdominal binder in place, blood saturating through left lower abdominal side. JP drain full with sanguinous content. Patient states she was straining to poop and thinks perhaps this triggered a stitch to pop. FINDINGS: LOWER CHEST: Minimal atelectasis is present at both lung bases. LIVER: The liver is unremarkable. GALLBLADDER AND BILE DUCTS: Gallbladder is  unremarkable. No biliary ductal dilatation. SPLEEN: No acute abnormality. PANCREAS: No acute abnormality. ADRENAL GLANDS: No acute abnormality. KIDNEYS, URETERS AND BLADDER: No stones in the kidneys or ureters. No hydronephrosis. No perinephric or periureteral stranding. Air is present in the urinary bladder, which may be secondary to urinary catheterization during the procedure. GI AND BOWEL: Stomach demonstrates no acute abnormality. There is no bowel obstruction. No bowel wall thickening. PERITONEUM AND RETROPERITONEUM: Extraperitoneal gas is present over the anterior abdomen. A surgical drain is in place. VASCULATURE: Aorta is normal in caliber. LYMPH NODES: No lymphadenopathy. REPRODUCTIVE ORGANS: No acute abnormality. BONES AND SOFT TISSUES: A left lateral subcutaneous hematoma measures 13.4 x 5.7 x 5.3 cm. The volume of this collection is approximately 202.7 cc. IMPRESSION: 1. Left lateral subcutaneous hematoma measuring 13.4 x 5.7 x 5.3 cm (volume approximately 202.7 cc). 2. No evidence of pneumoperitoneum or acute intraabdominal process. Electronically signed by: Lonni Necessary MD 05/10/2024 04:23 AM EDT RP Workstation: [id number]    Assessment/Plan: Patient has a left subcutaneous hematoma after abdominoplasty.  Does not appear to be actively bleeding but did have a hemoglobin drop and would benefit from evacuation of the hematoma and control of any bleeding that may still be ongoing.  We discussed risks that include bleeding, infection, damage to surrounding structures and need for additional procedures.  All of her questions were answered she is interested in moving forward.  Elisabeth GORMAN Coombs 05/10/2024, 5:58 AM

## 2024-05-10 NOTE — ED Triage Notes (Signed)
 Pt in POV with c/o bleeding from post-op site - pt states she had a tummy tuck at 11:30am this morning. Pt has abdominal binder in place, blood saturating through L lower abdominal side. JP drain full with sanguinous content. Pt states she was straining to poop and thinks perhaps this triggered a stitch to pop. Surgery done by [facility] - Dr. Elisabeth

## 2024-05-10 NOTE — Progress Notes (Addendum)
 2300 Pt reported she will be staying in the hospital for tonight as the pharmacy is closed & she would need medications to take home.  2305 Several attempts made to inform Dr Elisabeth via phone call to his office with messages left for a call back to notify them of pt deciding to stay overnight.--Awaits a response.

## 2024-05-10 NOTE — Sepsis Progress Note (Signed)
 Notified provider of need to order repeat lactic acid.

## 2024-05-10 NOTE — Anesthesia Postprocedure Evaluation (Signed)
 Anesthesia Post Note  Patient: Annette Khan  Procedure(s) Performed: EVACUATION ABDOMIONAL HEMATOMA (Abdomen)     Patient location during evaluation: PACU Anesthesia Type: General Level of consciousness: sedated and patient cooperative Pain management: pain level controlled Vital Signs Assessment: post-procedure vital signs reviewed and stable Respiratory status: spontaneous breathing Cardiovascular status: stable Anesthetic complications: no   There were no known notable events for this encounter.  Last Vitals:  Vitals:   05/10/24 1712 05/10/24 1944  BP: 105/69 121/73  Pulse: (!) 101 (!) 106  Resp:  20  Temp: 37.2 C 37.1 C  SpO2: 98% 99%    Last Pain:  Vitals:   05/10/24 2020  TempSrc:   PainSc: 0-No pain                 Norleen Pope

## 2024-05-10 NOTE — TOC CM/SW Note (Signed)
 Transition of Care [facility]) - Inpatient Brief Assessment   Patient Details  Name: Annette Khan MRN: [id number] Date of Birth: [birth year]  Transition of Care [facility]) CM/SW Contact:    Roxie KANDICE Stain, RN Phone Number: 05/10/2024, 11:56 AM   Clinical Narrative:  Patient admitted for hematoma after abdominoplasty yesterday.  No TOC needs at this time.    Transition of Care Asessment: Insurance and Status: Insurance coverage has been reviewed Patient has primary care physician: Yes Home environment has been reviewed: safe to discharg home Prior level of function:: independent Prior/Current Home Services: No current home services Social Drivers of Health Review: SDOH reviewed no interventions necessary Readmission risk has been reviewed: Yes Transition of care needs: no transition of care needs at this time

## 2024-05-11 ENCOUNTER — Encounter (HOSPITAL_COMMUNITY): Payer: Self-pay | Admitting: Plastic Surgery

## 2024-05-11 LAB — HEMOGLOBIN AND HEMATOCRIT, BLOOD
HCT: 21.8 % — ABNORMAL LOW (ref 36.0–46.0)
Hemoglobin: 7 g/dL — ABNORMAL LOW (ref 12.0–15.0)

## 2024-05-11 LAB — GLUCOSE, CAPILLARY: Glucose-Capillary: 171 mg/dL — ABNORMAL HIGH (ref 70–99)

## 2024-05-11 NOTE — Discharge Summary (Signed)
 Physician Discharge Summary   Patient: Annette Khan MRN: [id number] DOB: 1976/07/13  Admit date:     05/10/2024  Discharge date: 05/11/24  Discharge Physician: Elisabeth GORMAN Coombs   PCP: Verena Mems, MD   Recommendations at discharge:   Avoid strenuous activity.  Follow-up next week with your surgeon.  Discharge Diagnoses: Principal Problem:   Hematoma  Resolved Problems:   * No resolved hospital problems. [facility] Course: No notes on file  Assessment and Plan: No notes have been filed under this hospital service. Service: Hospitalist        Pain control - [place]  Controlled Substance Reporting System database was reviewed. and patient was instructed, not to drive, operate heavy machinery, perform activities at heights, swimming or participation in water  activities or provide baby-sitting services while on Pain, Sleep and Anxiety Medications; until their outpatient Physician has advised to do so again. Also recommended to not to take more than prescribed Pain, Sleep and Anxiety Medications.   Consultants: None Procedures performed: Hematoma evacuation Disposition: Home Diet recommendation:  Discharge Diet Orders (From admission, onward)     Start     Ordered   05/10/24 0000  Diet - low sodium heart healthy        05/10/24 1854           Regular diet  DISCHARGE MEDICATION: Allergies as of 05/11/2024       Reactions   Other Hives   Metformin And Related Diarrhea   Penicillins Hives   Prilosec [omeprazole] Nausea And Vomiting        Medication List     TAKE these medications    aspirin 81 MG tablet Take 81 mg by mouth daily.   BENEFIBER DRINK MIX PO Take 15 mLs by mouth as needed.   Biotin 10000 MCG Tabs Take by mouth daily.   buPROPion 150 MG 24 hr tablet Commonly known as: WELLBUTRIN XL Take 150 mg by mouth daily.   buPROPion 150 MG 24 hr tablet Commonly known as: WELLBUTRIN XL Take 1 tablet by mouth daily.   carvedilol  6.25 MG tablet Commonly known as: COREG Take 6.25 mg by mouth 2 (two) times daily.   cetirizine 10 MG tablet Commonly known as: ZYRTEC Take 10 mg by mouth daily.   CHEWABLE CALCIUM PO Take 1 tablet by mouth daily.   cloNIDine 0.1 MG tablet Commonly known as: CATAPRES Take 0.1 mg by mouth 2 (two) times daily.   doxycycline 100 MG tablet Commonly known as: VIBRA-TABS Take 100 mg by mouth daily.   Enskyce 0.15-30 MG-MCG tablet Generic drug: desogestrel-ethinyl estradiol Take 1 tablet by mouth daily.   estradiol 0.025 MG/24HR Commonly known as: VIVELLE-DOT 1 patch 2 (two) times a week.   estradiol 0.1 MG/GM vaginal cream Commonly known as: ESTRACE Place vaginally.   EVENING PRIMROSE OIL PO Take by mouth. With black cohosh   hyoscyamine 0.125 MG tablet Commonly known as: LEVSIN Take 1 tablet by mouth as needed.   ibuprofen  800 MG tablet Commonly known as: ADVIL  Take 1 tablet (800 mg total) by mouth every 8 (eight) hours as needed.   lisdexamfetamine 30 MG capsule Commonly known as: VYVANSE  Take 30 mg by mouth daily.   lisinopril 10 MG tablet Commonly known as: ZESTRIL Take 1 tablet by mouth daily.   MAGNESIUM PO Take 500 mg by mouth daily.   Mounjaro 7.5 MG/0.5ML Pen Generic drug: tirzepatide Inject 7.5 mg into the skin once a week.   MULTIVITAMIN ADULT PO  Take 1 tablet by mouth daily.   ondansetron  4 MG disintegrating tablet Commonly known as: ZOFRAN -ODT Take 4 mg by mouth every 6 (six) hours as needed.   Potassium 99 MG Tabs Take 99 mg by mouth daily.   progesterone 100 MG capsule Commonly known as: PROMETRIUM Take 100 mg by mouth at bedtime.   rosuvastatin 10 MG tablet Commonly known as: CRESTOR Take 10 mg by mouth daily.   simvastatin 20 MG tablet Commonly known as: ZOCOR Take 10 mg by mouth at bedtime.   Toujeo SoloStar 300 UNIT/ML Solostar Pen Generic drug: insulin  glargine (1 Unit Dial) Inject 60 Units into the skin daily at 6 (six)  AM.   traMADol 50 MG tablet Commonly known as: ULTRAM Take 50 mg by mouth every 4 (four) hours as needed.   VALERIAN ROOT PO Take 5,000 mg by mouth daily.   venlafaxine XR 150 MG 24 hr capsule Commonly known as: EFFEXOR-XR Take 1 capsule by mouth daily.   Vitamin D3 1000 units Caps Take 1,000 Units by mouth daily.         Discharge Exam: Filed Weights   05/10/24 0019  Weight: 94.3 kg    Condition at discharge: good  The results of significant diagnostics from this hospitalization (including imaging, microbiology, ancillary and laboratory) are listed below for reference.   Imaging Studies: CT ABDOMEN PELVIS W CONTRAST Result Date: 05/10/2024 EXAM: CT ABDOMEN AND PELVIS WITH CONTRAST 05/10/2024 04:05:35 AM TECHNIQUE: CT of the abdomen and pelvis was performed with the administration of intravenous contrast. Multiplanar reformatted images are provided for review. Automated exposure control, iterative reconstruction, and/or weight based adjustment of the mA/kV was utilized to reduce the radiation dose to as low as reasonably achievable. COMPARISON: CT abdomen and pelvis with contrast 12/21/22. CLINICAL HISTORY: Peritonitis or perforation suspected. Bleeding from post-op site - patient states she had a tummy tuck and lipo suction at 11:30am this morning. Patient has abdominal binder in place, blood saturating through left lower abdominal side. JP drain full with sanguinous content. Patient states she was straining to poop and thinks perhaps this triggered a stitch to pop. FINDINGS: LOWER CHEST: Minimal atelectasis is present at both lung bases. LIVER: The liver is unremarkable. GALLBLADDER AND BILE DUCTS: Gallbladder is unremarkable. No biliary ductal dilatation. SPLEEN: No acute abnormality. PANCREAS: No acute abnormality. ADRENAL GLANDS: No acute abnormality. KIDNEYS, URETERS AND BLADDER: No stones in the kidneys or ureters. No hydronephrosis. No perinephric or periureteral stranding.  Air is present in the urinary bladder, which may be secondary to urinary catheterization during the procedure. GI AND BOWEL: Stomach demonstrates no acute abnormality. There is no bowel obstruction. No bowel wall thickening. PERITONEUM AND RETROPERITONEUM: Extraperitoneal gas is present over the anterior abdomen. A surgical drain is in place. VASCULATURE: Aorta is normal in caliber. LYMPH NODES: No lymphadenopathy. REPRODUCTIVE ORGANS: No acute abnormality. BONES AND SOFT TISSUES: A left lateral subcutaneous hematoma measures 13.4 x 5.7 x 5.3 cm. The volume of this collection is approximately 202.7 cc. IMPRESSION: 1. Left lateral subcutaneous hematoma measuring 13.4 x 5.7 x 5.3 cm (volume approximately 202.7 cc). 2. No evidence of pneumoperitoneum or acute intraabdominal process. Electronically signed by: Lonni Necessary MD 05/10/2024 04:23 AM EDT RP Workstation: [id number]    Microbiology: Results for orders placed or performed during the hospital encounter of 05/10/24  Blood culture (routine x 2)     Status: None (Preliminary result)   Collection Time: 05/10/24  2:46 AM   Specimen: BLOOD  Result Value Ref  Range Status   Specimen Description   Final    BLOOD LEFT ANTECUBITAL Performed at [facility], [address] [place], [place] [address]    Special Requests   Final    BOTTLES DRAWN AEROBIC AND ANAEROBIC Blood Culture adequate volume Performed at [facility], [address] [place], [place] [address]    Culture   Final    NO GROWTH 1 DAY Performed at [facility], [address] [place], [place] [address]    Report Status PENDING  Incomplete  Blood culture (routine x 2)     Status: None (Preliminary result)   Collection Time: 05/10/24  4:20 AM   Specimen: BLOOD RIGHT FOREARM  Result Value Ref Range Status   Specimen Description   Final    BLOOD RIGHT FOREARM Performed at [facility], [address], [place], [place]  [address]    Special Requests   Final    BOTTLES DRAWN AEROBIC AND ANAEROBIC Blood Culture adequate volume Performed at [facility], [address], [place], [place] [address]    Culture   Final    NO GROWTH 1 DAY Performed at [facility], [address], [place], [place] [address]    Report Status PENDING  Incomplete  Culture, blood (single)     Status: None (Preliminary result)   Collection Time: 05/10/24 11:50 AM   Specimen: BLOOD  Result Value Ref Range Status   Specimen Description BLOOD RIGHT ANTECUBITAL  Final   Special Requests   Final    BOTTLES DRAWN AEROBIC AND ANAEROBIC Blood Culture adequate volume   Culture   Final    NO GROWTH 1 DAY Performed at [facility], [address] [place], [place] [address]    Report Status PENDING  Incomplete  Culture, blood (single) w Reflex to ID Panel     Status: None (Preliminary result)   Collection Time: 05/10/24 11:57 AM   Specimen: BLOOD  Result Value Ref Range Status   Specimen Description BLOOD SITE NOT SPECIFIED  Final   Special Requests   Final    BOTTLES DRAWN AEROBIC ONLY Blood Culture results may not be optimal due to an inadequate volume of blood received in culture bottles   Culture   Final    NO GROWTH 1 DAY Performed at [facility], [address], [place], [place] [address]    Report Status PENDING  Incomplete    Labs: CBC: Recent Labs  Lab 05/10/24 0158 05/10/24 0641 05/10/24 0819 05/10/24 1150 05/10/24 1738 05/11/24 0647  WBC 22.8*  --  13.2*  --   --   --   NEUTROABS 19.6*  --   --   --   --   --   HGB 10.5* 7.8* 7.8* 7.3* 7.5* 7.0*  HCT 32.3* 23.0* 23.7* 22.6* 22.9* 21.8*  MCV 88.7  --  87.8  --   --   --   PLT 342  --  284  --   --   --    Basic Metabolic Panel: Recent Labs  Lab 05/10/24 0158 05/10/24 0641  NA 133* 135  K 4.4 4.1  CL 100  --   CO2 21*  --   GLUCOSE 364*  --   BUN 21*  --   CREATININE 0.80  --   CALCIUM 8.7*  --    Liver Function  Tests: Recent Labs  Lab 05/10/24 0158  AST  22  ALT 17  ALKPHOS 50  BILITOT 0.8  PROT 6.8  ALBUMIN  3.6   CBG: Recent Labs  Lab 05/10/24 0804 05/10/24 1237 05/10/24 1713 05/10/24 1946 05/11/24 0839  GLUCAP 269* 224* 167* 168* 171*    Discharge time spent: less than 30 minutes.  Signed: Elisabeth GORMAN Coombs, MD 05/11/2024

## 2024-05-11 NOTE — Plan of Care (Signed)
Patient has discharge orders.

## 2024-05-11 NOTE — Plan of Care (Signed)

## 2024-05-12 LAB — TYPE AND SCREEN
ABO/RH(D): B POS
Antibody Screen: NEGATIVE
Unit division: 0
Unit division: 0

## 2024-05-12 LAB — BPAM RBC
Blood Product Expiration Date: 202508042359
Blood Product Expiration Date: 202508042359
Unit Type and Rh: 7300
Unit Type and Rh: 7300

## 2024-05-12 NOTE — Progress Notes (Signed)
 This is a note documenting patient's progress as of Friday morning, the 11th. Patient seen in her hospital room. There was some problems communicating overnight with the overnight nurse. As of 6 PM yesterday the plan was to discharge her home. Her hemoglobin has been stable as had her vital signs and she wanted to be released. I sent medications to her pharmacy and discuss the plan with the day nurse and her husband. Unfortunately, that night nurse had some confusion and was not able to contact me. She called our office which was closed numerous times and left numerous voicemails, but no one was monitoring those. We have an after hours number which the patient had and I gave the day Nurse my cell phone, but the night nurse made no attempt to contact me in any other way apart from calling our office which was closed. The patient did well overnight and feels like things are heading in the right direction. Drain output has been decreasing. Vital signs remain stable and hemoglobin as of this morning is essentially stable from the day before. On exam, there's no new swelling or drainage from her incisions. She is ambulating independently and interested in going home. We discussed precautions and to avoid straining. I have sent iron and folate supplements to a pharmacy in addition to some stronger pain medication in the event she needs it. The patient has our office phone number and after hours number for any urgent issues. Plan is to discharge her today with follow up in my office next week. All of her questions were answered.

## 2024-05-15 LAB — CULTURE, BLOOD (SINGLE)
Culture: NO GROWTH
Culture: NO GROWTH
Special Requests: ADEQUATE

## 2024-05-15 LAB — CULTURE, BLOOD (ROUTINE X 2)
Culture: NO GROWTH
Culture: NO GROWTH
Special Requests: ADEQUATE
Special Requests: ADEQUATE

## 2024-06-21 ENCOUNTER — Telehealth (INDEPENDENT_AMBULATORY_CARE_PROVIDER_SITE_OTHER): Payer: Self-pay

## 2024-06-21 NOTE — Telephone Encounter (Signed)
 Lvm to r/s in october

## 2024-07-04 ENCOUNTER — Institutional Professional Consult (permissible substitution) (INDEPENDENT_AMBULATORY_CARE_PROVIDER_SITE_OTHER)

## 2024-08-01 ENCOUNTER — Institutional Professional Consult (permissible substitution) (INDEPENDENT_AMBULATORY_CARE_PROVIDER_SITE_OTHER)

## 2024-09-06 NOTE — Progress Notes (Signed)
 [facility] [phone]   [phone]  INITIAL CONSULT NOTE  Patient Care Team: Verena Mems, MD as PCP - General (Family Medicine)  Hematological/Oncological History 04/10/2024: WBC 10.7, Hgb 13.5, MCV 84.6, Plt 355 08/14/2024: WBC 8.9, Hgb 12.8, MCV 79.9, Plt 332,   CHIEF COMPLAINTS/PURPOSE OF CONSULTATION:  Iron  deficiency without anemia  HISTORY OF PRESENTING ILLNESS:  Annette Khan 48 y.o. female with medical history significant for diabetes, hyperlipidemia, hypertension, IBS, sleep apnea and hiatal hernia presents to the hematology clinic for evaluation for iron  deficiency without anemia.  On exam today, Annette Khan reports having persistent fatigue which impacts her ADLs.  She has ongoing brain fog which interferes with her job.  She has shortness of breath with exertion.  She does have dizzy spells without any syncopal symptoms.  She has noticed hair loss and frequent headaches.  She denies pica including ice cravings.  She is compliant with taking iron  pills since August 2025 but struggles with constipation.  She takes MiraLAX for constipation but ends up having diarrhea periodically.  She has light spotting with her cycle and denies any overt signs of bleeding including hematochezia or melena.  Patient denies fevers, chills, night sweats, chest pain or cough.  She has no other complaints.  Rest of the 10 point ROS as below.  MEDICAL HISTORY:  Past Medical History:  Diagnosis Date   Diabetes (HCC)    Heart murmur    History of hiatal hernia    Hyperlipidemia    Hypertension    IBS (irritable bowel syndrome)    Kidney stone    Sleep apnea     SURGICAL HISTORY: Past Surgical History:  Procedure Laterality Date   COLONOSCOPY     DILITATION & CURRETTAGE/HYSTROSCOPY WITH HYDROTHERMAL ABLATION N/A 12/17/2020   Procedure: DILATATION & CURETTAGE/HYSTEROSCOPY WITH HYDROTHERMAL ABLATION, removal of IUD;  Surgeon: Rosalva Sawyer, MD;  Location:  [facility];  Service: Gynecology;  Laterality: N/A;   HEMATOMA EVACUATION N/A 05/10/2024   Procedure: EVACUATION ABDOMIONAL HEMATOMA;  Surgeon: Elisabeth Craig RAMAN, MD;  Location: [facility];  Service: Plastics;  Laterality: N/A;  abdominal evacuation of hematoma   LAPAROSCOPIC TUBAL LIGATION Bilateral 12/17/2020   Procedure: LAPAROSCOPIC BILATERAL TUBAL LIGATION;  Surgeon: Rosalva Sawyer, MD;  Location: [facility];  Service: Gynecology;  Laterality: Bilateral;   SPHINCTEROTOMY     at age 24    SOCIAL HISTORY: Social History   Socioeconomic History   Marital status: Married    Spouse name: Not on file   Number of children: Not on file   Years of education: Not on file   Highest education level: Not on file  Occupational History   Not on file  Tobacco Use   Smoking status: Never    Passive exposure: Current (minimal)   Smokeless tobacco: Never  Vaping Use   Vaping status: Never Used  Substance and Sexual Activity   Alcohol use: Yes    Comment: rare   Drug use: No   Sexual activity: Not on file  Other Topics Concern   Not on file  Social History Narrative   Not on file   Social Drivers of Health   Financial Resource Strain: Not on file  Food Insecurity: No Food Insecurity (09/07/2024)   Hunger Vital Sign    Worried About Running Out of Food in the Last Year: Never true    Ran Out of Food in the Last Year: Never true  Transportation Needs: No Transportation Needs (  09/06/2024)   PRAPARE - Administrator, Civil Service (Medical): No    Lack of Transportation (Non-Medical): No  Physical Activity: Not on file  Stress: Not on file  Social Connections: Not on file  Intimate Partner Violence: Not At Risk (09/07/2024)   Humiliation, Afraid, Rape, and Kick questionnaire    Fear of Current or Ex-Partner: No    Emotionally Abused: No    Physically Abused: No    Sexually Abused: No    FAMILY HISTORY: Family History  Problem Relation Age of Onset    Diabetes Mother    Diverticulitis Mother    Irritable bowel syndrome Mother    Dementia Mother    Stroke Mother    Transient ischemic attack Mother    Cancer - Other Mother        oral cancer   Diabetes Father    Heart attack Father    Irritable bowel syndrome Father    Diverticulitis Father    Hypertension Brother    Heart attack Maternal Grandfather    Colon cancer Neg Hx    Esophageal cancer Neg Hx    Rectal cancer Neg Hx    Stomach cancer Neg Hx     ALLERGIES:  is allergic to other, iron , metformin and related, penicillins, and prilosec [omeprazole].  MEDICATIONS:  Current Outpatient Medications  Medication Sig Dispense Refill   polyethylene glycol (MIRALAX / GLYCOLAX) 17 g packet Take 17 g by mouth daily.     aspirin 81 MG tablet Take 81 mg by mouth daily.     Biotin 89999 MCG TABS Take by mouth daily.     Biotin 10000 MCG TABS 1 tablet Orally Once a day     buPROPion (WELLBUTRIN XL) 150 MG 24 hr tablet Take 150 mg by mouth daily.     buPROPion (WELLBUTRIN XL) 150 MG 24 hr tablet Take 1 tablet by mouth daily. (Patient not taking: Reported on 11/30/2023)     Calcium-Vitamin D -Vitamin K (CHEWABLE CALCIUM PO) Take 1 tablet by mouth daily. (Patient not taking: Reported on 11/30/2023)     carvedilol (COREG) 6.25 MG tablet Take 6.25 mg by mouth 2 (two) times daily.     cetirizine (ZYRTEC) 10 MG tablet Take 10 mg by mouth daily.     Cholecalciferol (VITAMIN D3) 1000 units CAPS Take 1,000 Units by mouth daily. (Patient not taking: Reported on 09/07/2024)     cloNIDine (CATAPRES) 0.1 MG tablet Take 0.1 mg by mouth 2 (two) times daily.     doxycycline (VIBRA-TABS) 100 MG tablet Take 100 mg by mouth daily. (Patient not taking: Reported on 05/10/2024)     ENSKYCE 0.15-30 MG-MCG tablet Take 1 tablet by mouth daily. (Patient not taking: Reported on 09/07/2024)     estradiol (ESTRACE) 0.1 MG/GM vaginal cream Place vaginally.     estradiol (VIVELLE-DOT) 0.025 MG/24HR 1 patch 2 (two) times a  week.     EVENING PRIMROSE OIL PO Take by mouth. With black cohosh     hyoscyamine (LEVSIN, ANASPAZ) 0.125 MG tablet Take 1 tablet by mouth as needed. (Patient not taking: Reported on 11/30/2023)     ibuprofen  (ADVIL ) 800 MG tablet Take 1 tablet (800 mg total) by mouth every 8 (eight) hours as needed. 30 tablet 0   lisdexamfetamine (VYVANSE ) 30 MG capsule Take 30 mg by mouth daily.     lisinopril (PRINIVIL,ZESTRIL) 10 MG tablet Take 1 tablet by mouth daily. (Patient not taking: Reported on 05/10/2024)     MAGNESIUM  PO Take 500 mg by mouth daily.     Multiple Vitamins-Minerals (MULTIVITAMIN ADULT PO) Take 1 tablet by mouth daily.     ondansetron  (ZOFRAN -ODT) 4 MG disintegrating tablet Take 4 mg by mouth every 6 (six) hours as needed. (Patient not taking: Reported on 09/07/2024)     Potassium 99 MG TABS Take 99 mg by mouth daily.     progesterone (PROMETRIUM) 100 MG capsule Take 100 mg by mouth at bedtime.     rosuvastatin (CRESTOR) 10 MG tablet Take 10 mg by mouth daily. (Patient not taking: Reported on 09/07/2024)     simvastatin (ZOCOR) 20 MG tablet Take 10 mg by mouth at bedtime. (Patient not taking: Reported on 09/07/2024)     tirzepatide (MOUNJARO) 7.5 MG/0.5ML Pen Inject 7.5 mg into the skin once a week.     TOUJEO SOLOSTAR 300 UNIT/ML Solostar Pen Inject 60 Units into the skin daily at 6 (six) AM.     traMADol (ULTRAM) 50 MG tablet Take 50 mg by mouth every 4 (four) hours as needed. (Patient not taking: Reported on 09/07/2024)     VALERIAN ROOT PO Take 5,000 mg by mouth daily.     venlafaxine XR (EFFEXOR-XR) 150 MG 24 hr capsule Take 1 capsule by mouth daily.     Wheat Dextrin (BENEFIBER DRINK MIX PO) Take 15 mLs by mouth as needed.     No current facility-administered medications for this visit.    REVIEW OF SYSTEMS:   Constitutional: ( - ) fevers, ( - )  chills , ( - ) night sweats Eyes: ( - ) blurriness of vision, ( - ) double vision, ( - ) watery eyes Ears, nose, mouth, throat, and  face: ( - ) mucositis, ( - ) sore throat Respiratory: ( - ) cough, ( + ) dyspnea, ( - ) wheezes Cardiovascular: ( - ) palpitation, ( - ) chest discomfort, ( - ) lower extremity swelling Gastrointestinal:  ( - ) nausea, ( - ) heartburn, ( - ) change in bowel habits Skin: ( - ) abnormal skin rashes Lymphatics: ( - ) new lymphadenopathy, ( - ) easy bruising Neurological: ( - ) numbness, ( - ) tingling, ( - ) new weaknesses Behavioral/Psych: ( - ) mood change, ( - ) new changes  All other systems were reviewed with the patient and are negative.  PHYSICAL EXAMINATION: ECOG PERFORMANCE STATUS: 1 - Symptomatic but completely ambulatory  Vitals:   09/07/24 0853 09/07/24 0902  BP: (!) 144/81 (!) 142/80  Pulse: 79   Resp: 17   Temp: (!) 97.5 F (36.4 C)   SpO2: 100%    Filed Weights   09/07/24 0853  Weight: 219 lb (99.3 kg)    GENERAL: well appearing female in NAD  SKIN: skin color, texture, turgor are normal, no rashes or significant lesions EYES: conjunctiva are pink and non-injected, sclera clear LUNGS: clear to auscultation and percussion with normal breathing effort HEART: regular rate & rhythm and no murmurs and no lower extremity edema Musculoskeletal: no cyanosis of digits and no clubbing  PSYCH: alert & oriented x 3, fluent speech NEURO: no focal motor/sensory deficits  LABORATORY DATA:  I have reviewed the data as listed    Latest Ref Rng & Units 05/11/2024    6:47 AM 05/10/2024    5:38 PM 05/10/2024   11:50 AM  CBC  Hemoglobin 12.0 - 15.0 g/dL 7.0  7.5  7.3   Hematocrit 36.0 - 46.0 % 21.8  22.9  22.6  Latest Ref Rng & Units 05/10/2024    6:41 AM 05/10/2024    1:58 AM 11/30/2022   11:20 AM  CMP  Glucose 70 - 99 mg/dL  635  807   BUN 6 - 20 mg/dL  21  18   Creatinine 9.55 - 1.00 mg/dL  9.19  9.31   Sodium 864 - 145 mmol/L 135  133  136   Potassium 3.5 - 5.1 mmol/L 4.1  4.4  4.4   Chloride 98 - 111 mmol/L  100  99   CO2 22 - 32 mmol/L  21  28   Calcium 8.9 -  10.3 mg/dL  8.7  9.5   Total Protein 6.5 - 8.1 g/dL  6.8  7.3   Total Bilirubin 0.0 - 1.2 mg/dL  0.8  0.3   Alkaline Phos 38 - 126 U/L  50    AST 15 - 41 U/L  22  13   ALT 0 - 44 U/L  17  23      RADIOGRAPHIC STUDIES: I have personally reviewed the radiological images as listed and agreed with the findings in the report. No results found.  ASSESSMENT & PLAN Annette Khan is a 48 y.o. female who presents to the clinic for evaluation for iron  deficiency without anemia.   #Iron  deficiency without anemia: --Differentials include menstrual bleeding but suggest GI evaluation as patient has IBS and h/o hiatal hernia.  --Last colonoscopy was 12/24/2022 with no overt signs of bleeding. Scheduled for follow up wit GI later this month.  --Patient will proceed with labs today to check CBC, CMP, retic panel, iron  panel --Okay to discontinue PO iron  due to GI intolerance --Will arrange for IV iron  based on today's labs --RTC in 3 months with labs   No orders of the defined types were placed in this encounter.   All questions were answered. The patient knows to call the clinic with any problems, questions or concerns.  I have spent a total of 60 minutes minutes of face-to-face and non-face-to-face time, preparing to see the patient, obtaining and/or reviewing separately obtained history, performing a medically appropriate examination, counseling and educating the patient, ordering tests/procedures,  documenting clinical information in the electronic health record, independently interpreting results and communicating results to the patient, and care coordination.   Johnston Police, PA-C Department of Hematology/Oncology [facility] at [facility] Phone: [phone]  Patient was seen with Dr. Federico  I have read the above note and personally examined the patient. I agree with the assessment and plan as noted above.  Briefly Annette Khan is a 48 year old female who  presents for evaluation of iron  deficiency without anemia.  The patient is having symptoms of anemia such as fatigue but has a normal hemoglobin.  The patient does have a hiatal hernia which may be contributing to her iron  deficiency, as her menstrual cycles are quite light.  She does follow with gastroenterology for irritable bowel syndrome.  She has taken p.o. iron  therapy before but it is causing stomach upset.  Therefore I would recommend repeat iron  labs today and if she is found to be persistently deficient recommend pursuing IV iron  therapy.  Patient voiced understanding of our findings and recommendations moving forward.   Norleen IVAR Federico, MD Department of Hematology/Oncology [facility] Phone: [phone] Pager: [phone] Email: [email or web address]

## 2024-09-07 ENCOUNTER — Inpatient Hospital Stay

## 2024-09-07 ENCOUNTER — Ambulatory Visit: Payer: Self-pay | Admitting: Physician Assistant

## 2024-09-07 ENCOUNTER — Telehealth: Payer: Self-pay

## 2024-09-07 ENCOUNTER — Inpatient Hospital Stay: Attending: Physician Assistant | Admitting: Physician Assistant

## 2024-09-07 ENCOUNTER — Encounter: Payer: Self-pay | Admitting: Physician Assistant

## 2024-09-07 ENCOUNTER — Other Ambulatory Visit: Payer: Self-pay | Admitting: Physician Assistant

## 2024-09-07 VITALS — BP 142/80 | HR 79 | Temp 97.5°F | Resp 17 | Ht 63.5 in | Wt 219.0 lb

## 2024-09-07 DIAGNOSIS — E611 Iron deficiency: Secondary | ICD-10-CM | POA: Diagnosis present

## 2024-09-07 LAB — CMP (CANCER CENTER ONLY)
ALT: 15 U/L (ref 0–44)
AST: 13 U/L — ABNORMAL LOW (ref 15–41)
Albumin: 4.3 g/dL (ref 3.5–5.0)
Alkaline Phosphatase: 86 U/L (ref 38–126)
Anion gap: 6 (ref 5–15)
BUN: 13 mg/dL (ref 6–20)
CO2: 31 mmol/L (ref 22–32)
Calcium: 9.6 mg/dL (ref 8.9–10.3)
Chloride: 97 mmol/L — ABNORMAL LOW (ref 98–111)
Creatinine: 0.76 mg/dL (ref 0.44–1.00)
GFR, Estimated: >60 mL/min (ref >60)
Glucose, Bld: 240 mg/dL — ABNORMAL HIGH (ref 70–99)
Potassium: 4.5 mmol/L (ref 3.5–5.1)
Sodium: 134 mmol/L — ABNORMAL LOW (ref 135–145)
Total Bilirubin: 0.4 mg/dL (ref 0.0–1.2)
Total Protein: 7.6 g/dL (ref 6.5–8.1)

## 2024-09-07 LAB — CBC WITH DIFFERENTIAL (CANCER CENTER ONLY)
Abs Immature Granulocytes: 0.03 K/uL (ref 0.00–0.07)
Basophils Absolute: 0.1 K/uL (ref 0.0–0.1)
Basophils Relative: 1 %
Eosinophils Absolute: 0.1 K/uL (ref 0.0–0.5)
Eosinophils Relative: 1 %
HCT: 40.3 % (ref 36.0–46.0)
Hemoglobin: 13.4 g/dL (ref 12.0–15.0)
Immature Granulocytes: 0 %
Lymphocytes Relative: 33 %
Lymphs Abs: 3 K/uL (ref 0.7–4.0)
MCH: 26.9 pg (ref 26.0–34.0)
MCHC: 33.3 g/dL (ref 30.0–36.0)
MCV: 80.8 fL (ref 80.0–100.0)
Monocytes Absolute: 0.6 K/uL (ref 0.1–1.0)
Monocytes Relative: 6 %
Neutro Abs: 5.3 K/uL (ref 1.7–7.7)
Neutrophils Relative %: 59 %
Platelet Count: 338 K/uL (ref 150–400)
RBC: 4.99 MIL/uL (ref 3.87–5.11)
RDW: 14.5 % (ref 11.5–15.5)
WBC Count: 9.1 K/uL (ref 4.0–10.5)
nRBC: 0 % (ref 0.0–0.2)

## 2024-09-07 LAB — IRON AND IRON BINDING CAPACITY (CC-WL,HP ONLY)
Iron: 53 ug/dL (ref 28–170)
Saturation Ratios: 14 % (ref 10.4–31.8)
TIBC: 386 ug/dL (ref 250–450)
UIBC: 333 ug/dL (ref 148–442)

## 2024-09-07 LAB — RETIC PANEL
Immature Retic Fract: 14.7 % (ref 2.3–15.9)
RBC.: 4.97 MIL/uL (ref 3.87–5.11)
Retic Count, Absolute: 77 K/uL (ref 19.0–186.0)
Retic Ct Pct: 1.6 % (ref 0.4–3.1)
Reticulocyte Hemoglobin: 32.8 pg (ref >27.9)

## 2024-09-07 LAB — FERRITIN: Ferritin: 79 ng/mL (ref 11–307)

## 2024-09-07 NOTE — Telephone Encounter (Signed)
 Johnston, patient will be scheduled as soon as possible.  Auth Submission: NO AUTH NEEDED Site of care: Site of care: [facility] Payer: [employer] commercial Medication & CPT/J Code(s) submitted: Venofer (Iron Sucrose) J1756 Diagnosis Code:  Route of submission (phone, fax, portal):  Phone # Fax # Auth type: Buy/Bill PB Units/visits requested: 200mg  x 2 doses Reference number:  Approval from: 09/07/24 to 10/31/24

## 2024-09-11 ENCOUNTER — Ambulatory Visit (INDEPENDENT_AMBULATORY_CARE_PROVIDER_SITE_OTHER)

## 2024-09-11 VITALS — BP 149/89 | HR 84 | Temp 99.1°F | Resp 18 | Ht 64.0 in | Wt 220.6 lb

## 2024-09-11 DIAGNOSIS — N182 Chronic kidney disease, stage 2 (mild): Secondary | ICD-10-CM

## 2024-09-11 DIAGNOSIS — E611 Iron deficiency: Secondary | ICD-10-CM

## 2024-09-11 MED ORDER — IRON SUCROSE 20 MG/ML IV SOLN
200.0000 mg | Freq: Once | INTRAVENOUS | Status: AC
  Administered 2024-09-11: 200 mg via INTRAVENOUS
  Filled 2024-09-11: qty 10

## 2024-09-11 NOTE — Patient Instructions (Signed)

## 2024-09-11 NOTE — Progress Notes (Signed)
 Diagnosis: Iron Deficiency Anemia  Provider:  Chilton Greathouse MD  Procedure: IV Push  IV Type: Peripheral, IV Location: L Antecubital  Venofer (Iron Sucrose), Dose: 200 mg  Post Infusion IV Care: Observation period completed and Peripheral IV Discontinued  Discharge: Condition: Good, Destination: Home . AVS Declined  Performed by:  Adriana Mccallum, RN

## 2024-09-18 ENCOUNTER — Ambulatory Visit (INDEPENDENT_AMBULATORY_CARE_PROVIDER_SITE_OTHER)

## 2024-09-18 VITALS — BP 132/87 | HR 91 | Temp 98.4°F | Resp 14 | Ht 64.0 in | Wt 219.2 lb

## 2024-09-18 DIAGNOSIS — N182 Chronic kidney disease, stage 2 (mild): Secondary | ICD-10-CM | POA: Diagnosis not present

## 2024-09-18 DIAGNOSIS — E611 Iron deficiency: Secondary | ICD-10-CM

## 2024-09-18 MED ORDER — IRON SUCROSE 20 MG/ML IV SOLN
200.0000 mg | Freq: Once | INTRAVENOUS | Status: AC
  Administered 2024-09-18: 200 mg via INTRAVENOUS
  Filled 2024-09-18: qty 10

## 2024-09-18 NOTE — Progress Notes (Signed)
 Diagnosis: Acute Anemia  Provider:  Chilton Greathouse MD  Procedure: IV Push  IV Type: Peripheral, IV Location: L Antecubital  Venofer (Iron Sucrose), Dose: 200 mg  Post Infusion IV Care: Observation period completed and Peripheral IV Discontinued  Discharge: Condition: Good, Destination: Home . AVS Declined  Performed by:  Nat Math, RN

## 2024-09-25 ENCOUNTER — Encounter: Payer: Self-pay | Admitting: Physician Assistant

## 2024-09-25 ENCOUNTER — Encounter: Payer: Self-pay | Admitting: Gastroenterology

## 2024-09-25 ENCOUNTER — Ambulatory Visit (INDEPENDENT_AMBULATORY_CARE_PROVIDER_SITE_OTHER): Admitting: Gastroenterology

## 2024-09-25 VITALS — BP 130/82 | HR 87 | Ht 64.0 in | Wt 222.0 lb

## 2024-09-25 DIAGNOSIS — D508 Other iron deficiency anemias: Secondary | ICD-10-CM

## 2024-09-25 DIAGNOSIS — R195 Other fecal abnormalities: Secondary | ICD-10-CM

## 2024-09-25 NOTE — Progress Notes (Signed)
 [facility] Progress Note  Chief Complaint: Iron  deficiency and heme positive stool  Subjective  Prior history  January 2024 APP office consult for longstanding [place].  Was doing well from that standpoint on Effexor and as needed Levsin. February 2024 screening colonoscopy to cecum.  Good prep, diminutive TA, hypertrophied anal papilla (7-year surveillance recall recommended)  Referred to hematology for iron  deficiency anemia with hemoglobin of 7 in July 2025. This anemia was found in the setting of a large abdominal wall hematoma that occurred from abdominoplasty   Oral iron  normalized hemoglobin and iron  levels, but causes GI upset and constipation. Hematology gave 2 doses of Venofer  and patient was referred to us  for consideration of EGD to evaluate the IDA.  Discussed the use of AI scribe software for clinical note transcription with the patient, who gave verbal consent to proceed.  History of Present Illness Annette Khan is a 48 year old female with iron  deficiency who presents with concerns about persistent symptoms despite treatment.  Iron  deficiency and anemia - Reportedly diagnosed with iron  deficiency a year ago by her dermatologist after evaluation for hair loss other symptoms such as fatigue and brain fog -She has apparently had low iron  saturation with normal hemoglobin until she became anemic after her cosmetic surgery noted above.   Gastrointestinal bleeding - Stool test positive for blood.  (Reportedly done by primary care recently) - Anal bleeding present occasionally, suspected to be due to minor fissures or hemorrhoids. - No melena (black tarry stools).  Altered bowel habits and irritable bowel syndrome - Long-standing history of irritable bowel syndrome for over 24 years. - Symptoms include nausea associated with pain and significant changes in bowel habits. - Alternating diarrhea and constipation, though in the last year  or so more constipation, perhaps influenced by medications including Mounjaro and venlafaxine. - Miralax used to manage constipation induced by Mounjaro.   Additional gastrointestinal symptoms - No dysphagia (difficulty swallowing) or odynophagia (painful swallowing).    ROS: Cardiovascular:  no chest pain Respiratory: no dyspnea Fatigue Remainder of systems negative except as above The patient's Past Medical, Family and Social History were reviewed and are on file in the EMR. Past Medical History:  Diagnosis Date   Diabetes (HCC)    Heart murmur    History of hiatal hernia    Hyperlipidemia    Hypertension    IBS (irritable bowel syndrome)    Kidney stone    Sleep apnea     Past Surgical History:  Procedure Laterality Date   COLONOSCOPY     DILITATION & CURRETTAGE/HYSTROSCOPY WITH HYDROTHERMAL ABLATION N/A 12/17/2020   Procedure: DILATATION & CURETTAGE/HYSTEROSCOPY WITH HYDROTHERMAL ABLATION, removal of IUD;  Surgeon: Rosalva Sawyer, MD;  Location: [facility];  Service: Gynecology;  Laterality: N/A;   HEMATOMA EVACUATION N/A 05/10/2024   Procedure: EVACUATION ABDOMIONAL HEMATOMA;  Surgeon: Elisabeth Craig RAMAN, MD;  Location: [facility] OR;  Service: Plastics;  Laterality: N/A;  abdominal evacuation of hematoma   LAPAROSCOPIC TUBAL LIGATION Bilateral 12/17/2020   Procedure: LAPAROSCOPIC BILATERAL TUBAL LIGATION;  Surgeon: Rosalva Sawyer, MD;  Location: [facility];  Service: Gynecology;  Laterality: Bilateral;   SPHINCTEROTOMY     at age 36     Objective:  Med list reviewed  Current Outpatient Medications:    aspirin 81 MG tablet, Take 81 mg by mouth daily., Disp: , Rfl:    Biotin 89999 MCG TABS, Take by mouth daily., Disp: , Rfl:  Biotin 10000 MCG TABS, 1 tablet Orally Once a day, Disp: , Rfl:    buPROPion (WELLBUTRIN XL) 150 MG 24 hr tablet, Take 150 mg by mouth daily., Disp: , Rfl:    carvedilol (COREG) 6.25 MG tablet, Take 6.25 mg by mouth 2 (two) times  daily., Disp: , Rfl:    cetirizine (ZYRTEC) 10 MG tablet, Take 10 mg by mouth daily., Disp: , Rfl:    Cholecalciferol (VITAMIN D3) 1000 units CAPS, Take 1,000 Units by mouth daily., Disp: , Rfl:    cloNIDine (CATAPRES) 0.1 MG tablet, Take 0.1 mg by mouth 2 (two) times daily., Disp: , Rfl:    estradiol (ESTRACE) 0.1 MG/GM vaginal cream, Place vaginally., Disp: , Rfl:    estradiol (VIVELLE-DOT) 0.025 MG/24HR, 1 patch 2 (two) times a week., Disp: , Rfl:    EVENING PRIMROSE OIL PO, Take by mouth. With black cohosh, Disp: , Rfl:    finasteride (PROSCAR) 5 MG tablet, Take 2.5 mg by mouth daily., Disp: , Rfl:    ibuprofen  (ADVIL ) 800 MG tablet, Take 1 tablet (800 mg total) by mouth every 8 (eight) hours as needed., Disp: 30 tablet, Rfl: 0   lisdexamfetamine (VYVANSE ) 30 MG capsule, Take 30 mg by mouth daily., Disp: , Rfl:    MAGNESIUM PO, Take 500 mg by mouth daily., Disp: , Rfl:    Multiple Vitamins-Minerals (MULTIVITAMIN ADULT PO), Take 1 tablet by mouth daily., Disp: , Rfl:    polyethylene glycol (MIRALAX / GLYCOLAX) 17 g packet, Take 17 g by mouth daily., Disp: , Rfl:    Potassium 99 MG TABS, Take 99 mg by mouth daily., Disp: , Rfl:    progesterone (PROMETRIUM) 100 MG capsule, Take 100 mg by mouth at bedtime., Disp: , Rfl:    rosuvastatin (CRESTOR) 10 MG tablet, Take 10 mg by mouth daily., Disp: , Rfl:    tirzepatide (MOUNJARO) 7.5 MG/0.5ML Pen, Inject 7.5 mg into the skin once a week., Disp: , Rfl:    TOUJEO SOLOSTAR 300 UNIT/ML Solostar Pen, Inject 60 Units into the skin daily at 6 (six) AM., Disp: , Rfl:    VALERIAN ROOT PO, Take 5,000 mg by mouth daily., Disp: , Rfl:    venlafaxine XR (EFFEXOR-XR) 150 MG 24 hr capsule, Take 1 capsule by mouth daily., Disp: , Rfl:    Vital signs in last 24 hrs: Vitals:   09/25/24 1121  BP: 130/82  Pulse: 87   Wt Readings from Last 3 Encounters:  09/25/24 222 lb (100.7 kg)  09/18/24 219 lb 3.2 oz (99.4 kg)  09/11/24 220 lb 9.6 oz (100.1 kg)     Physical Exam  Well-appearing HEENT: sclera anicteric, oral mucosa moist without lesions Neck: supple, no thyromegaly, JVD or lymphadenopathy Cardiac: Regular without appreciable murmur,  no peripheral edema Pulm: clear to auscultation bilaterally, normal RR and effort noted Abdomen: soft, no tenderness, with active bowel sounds. No guarding or palpable hepatosplenomegaly. Skin; warm and dry, no jaundice or rash   Labs:     Latest Ref Rng & Units 09/07/2024   10:15 AM 05/11/2024    6:47 AM 05/10/2024    5:38 PM  CBC  WBC 4.0 - 10.5 K/uL 9.1     Hemoglobin 12.0 - 15.0 g/dL 86.5  7.0  7.5   Hematocrit 36.0 - 46.0 % 40.3  21.8  22.9   Platelets 150 - 400 K/uL 338      On 05/10/2024 arrival to ED, patient had WBC of 22,000, hemoglobin 10.5 and platelets  342K. No preop labs on file in this EHR  Iron /TIBC/Ferritin/ %Sat    Component Value Date/Time   IRON  53 09/07/2024 1015   TIBC 386 09/07/2024 1015   FERRITIN 79 09/07/2024 1016   IRONPCTSAT 14 09/07/2024 1015    ___________________________________________ Radiologic studies:  Narrative & Impression  EXAM: CT ABDOMEN AND PELVIS WITH CONTRAST 05/10/2024 04:05:35 AM   TECHNIQUE: CT of the abdomen and pelvis was performed with the administration of intravenous contrast. Multiplanar reformatted images are provided for review. Automated exposure control, iterative reconstruction, and/or weight based adjustment of the mA/kV was utilized to reduce the radiation dose to as low as reasonably achievable.   COMPARISON: CT abdomen and pelvis with contrast 12/21/22.   CLINICAL HISTORY: Peritonitis or perforation suspected. Bleeding from post-op site - patient states she had a tummy tuck and lipo suction at 11:30am this morning. Patient has abdominal binder in place, blood saturating through left lower abdominal side. JP drain full with sanguinous content. Patient states she was straining to poop and thinks perhaps this triggered  a stitch to pop.   FINDINGS:   LOWER CHEST: Minimal atelectasis is present at both lung bases.   LIVER: The liver is unremarkable.   GALLBLADDER AND BILE DUCTS: Gallbladder is unremarkable. No biliary ductal dilatation.   SPLEEN: No acute abnormality.   PANCREAS: No acute abnormality.   ADRENAL GLANDS: No acute abnormality.   KIDNEYS, URETERS AND BLADDER: No stones in the kidneys or ureters. No hydronephrosis. No perinephric or periureteral stranding. Air is present in the urinary bladder, which may be secondary to urinary catheterization during the procedure.   GI AND BOWEL: Stomach demonstrates no acute abnormality. There is no bowel obstruction. No bowel wall thickening.   PERITONEUM AND RETROPERITONEUM: Extraperitoneal gas is present over the anterior abdomen. A surgical drain is in place.   VASCULATURE: Aorta is normal in caliber.   LYMPH NODES: No lymphadenopathy.   REPRODUCTIVE ORGANS: No acute abnormality.   BONES AND SOFT TISSUES: A left lateral subcutaneous hematoma measures 13.4 x 5.7 x 5.3 cm. The volume of this collection is approximately 202.7 cc.   IMPRESSION: 1. Left lateral subcutaneous hematoma measuring 13.4 x 5.7 x 5.3 cm (volume approximately 202.7 cc). 2. No evidence of pneumoperitoneum or acute intraabdominal process.   Electronically signed by: Lonni Necessary MD 05/10/2024 04:23 AM EDT RP Workstation: [id number]   ____________________________________________ Other:   _____________________________________________   Encounter Diagnoses  Name Primary?   Heme positive stool Yes   Other iron  deficiency anemia     Assessment & Plan Iron  deficiency with fatigue and hair loss Iron  saturation low despite normal iron  levels and ferritin. Hematologist concerned about low iron  saturation and the possibility for either iron  malabsorption or GI blood loss.  Positive stool test for blood. Differential includes malabsorption issues  such as celiac disease.  - Schedule upper endoscopy to evaluate for blood loss and assess for malabsorption conditions such as celiac disease.  Irritable bowel syndrome Long-standing IBS with nausea and variable bowel habits. Sodium and chloride levels low, raising concern for absorption issues. - Continue Mounjaro and Miralax.  Anal bleeding likely due to minor fissures Intermittent anal bleeding likely from minor fissures. Previous colonoscopy showed no significant hemorrhoids, slightly prominent anal papilla noted. - Monitor symptoms and manage bowel habits to prevent further tearing.  She also recalls having been told she had a hiatal hernia on previous endoscopic testing decades ago.  If that is sizable, could cause Cameron erosions as a source for  IDA and heme positive stool.  Naturally, it will be evaluated at the time of EGD.  She was agreeable to an upper endoscopy after discussion of procedure and risks.  The benefits and risks of the planned procedure(s) were described in detail with the patient or (when appropriate) their health care proxy.  Risks were outlined as including, but not limited to, bleeding, infection, perforation, adverse medication reaction leading to cardiac or pulmonary decompensation, pancreatitis (if ERCP).  The limitation of incomplete mucosal visualization was also discussed.  No guarantees or warranties were given.    Victory LITTIE Brand III

## 2024-09-25 NOTE — Patient Instructions (Signed)
 You have been scheduled for an endoscopy. Please follow the written instructions given to you at your visit today.  If you use inhalers (even only as needed), please bring them with you on the day of your procedure.  DO NOT TAKE 7 DAYS PRIOR TO TEST- Trulicity (dulaglutide) Ozempic, Wegovy (semaglutide) Mounjaro, Zepbound (tirzepatide) Bydureon Bcise (exanatide extended release)  DO NOT TAKE 1 DAY PRIOR TO YOUR TEST Rybelsus (semaglutide) Adlyxin (lixisenatide) Victoza (liraglutide) Byetta (exanatide) ___________________________________________________________________________   Thank you for entrusting me with your care and for choosing [facility], Dr. Victory Brand  _______________________________________________________  If your blood pressure at your visit was 140/90 or greater, please contact your primary care physician to follow up on this.  _______________________________________________________  If you are age 11 or older, your body mass index should be between 23-30. Your Body mass index is 38.11 kg/m. If this is out of the aforementioned range listed, please consider follow up with your Primary Care Provider.  If you are age 98 or younger, your body mass index should be between 19-25. Your Body mass index is 38.11 kg/m. If this is out of the aformentioned range listed, please consider follow up with your Primary Care Provider.   ________________________________________________________  The [facility] providers would like to encourage you to use MYCHART to communicate with providers for non-urgent requests or questions.  Due to long hold times on the telephone, sending your provider a message by [facility] may be a faster and more efficient way to get a response.  Please allow 48 business hours for a response.  Please remember that this is for non-urgent requests.  _______________________________________________________  [facility] is using a team-based  approach to care.  Your team is made up of your doctor and two to three APPS. Our APPS (Nurse Practitioners and Physician Assistants) work with your physician to ensure care continuity for you. They are fully qualified to address your health concerns and develop a treatment plan. They communicate directly with your gastroenterologist to care for you. Seeing the Advanced Practice Practitioners on your physician's team can help you by facilitating care more promptly, often allowing for earlier appointments, access to diagnostic testing, procedures, and other specialty referrals.

## 2024-10-24 ENCOUNTER — Encounter: Payer: Self-pay | Admitting: Gastroenterology

## 2024-10-24 ENCOUNTER — Ambulatory Visit: Admitting: Gastroenterology

## 2024-10-24 VITALS — BP 114/76 | HR 89 | Temp 98.2°F | Resp 17 | Ht 64.0 in | Wt 222.0 lb

## 2024-10-24 DIAGNOSIS — D508 Other iron deficiency anemias: Secondary | ICD-10-CM | POA: Diagnosis not present

## 2024-10-24 DIAGNOSIS — D509 Iron deficiency anemia, unspecified: Secondary | ICD-10-CM | POA: Diagnosis present

## 2024-10-24 MED ORDER — SODIUM CHLORIDE 0.9 % IV SOLN: 500.0000 mL | Freq: Once | INTRAVENOUS | Status: DC

## 2024-10-24 NOTE — Progress Notes (Signed)
 Pt's states no medical or surgical changes since previsit or office visit.

## 2024-10-24 NOTE — Progress Notes (Signed)
 Report given to PACU, vss

## 2024-10-24 NOTE — Patient Instructions (Signed)

## 2024-10-24 NOTE — Op Note (Signed)
 [facility] Patient Name: Annette Khan Procedure Date: 10/24/2024 8:01 AM MRN: [id number] Endoscopist: Victory L. Legrand , MD, [id number] Age: 48 Referring MD:  Date of Birth: Mar 31, 1976 Gender: Female Account #: [id number] Procedure:                Upper GI endoscopy Indications:              Iron  deficiency anemia (normalized after IV iron .                            Significant blood loss after abdominoplasty earlier                            this year)                           Clinical details in 09/25/2024 office note Medicines:                Monitored Anesthesia Care Procedure:                Pre-Anesthesia Assessment:                           - Prior to the procedure, a History and Physical                            was performed, and patient medications and                            allergies were reviewed. The patient's tolerance of                            previous anesthesia was also reviewed. The risks                            and benefits of the procedure and the sedation                            options and risks were discussed with the patient.                            All questions were answered, and informed consent                            was obtained. Prior Anticoagulants: The patient has                            taken no anticoagulant or antiplatelet agents. ASA                            Grade Assessment: II - A patient with mild systemic                            disease. After reviewing the risks and benefits,  the patient was deemed in satisfactory condition to                            undergo the procedure.                           After obtaining informed consent, the endoscope was                            passed under direct vision. Throughout the                            procedure, the patient's blood pressure, pulse, and                            oxygen saturations were monitored  continuously. The                            Olympus Scope [id number] was introduced through the                            mouth, and advanced to the second part of duodenum.                            The upper GI endoscopy was accomplished without                            difficulty. The patient tolerated the procedure                            well. Scope In: Scope Out: Findings:                 The larynx was normal.                           The esophagus was normal.                           The stomach was normal.                           The cardia and gastric fundus were normal on                            retroflexion. (Hill grade 2)                           Normal mucosa was found in the entire duodenum.                            Biopsies for histology were taken with a cold                            forceps for evaluation of celiac disease. Complications:  No immediate complications. Estimated Blood Loss:     Estimated blood loss was minimal. Impression:               - Normal larynx.                           - Normal esophagus.                           - Normal stomach.                           - Normal mucosa was found in the entire examined                            duodenum. Biopsied.                           No source of upper GI blood loss to account for                            anemia. Recommendation:           - Patient has a contact number available for                            emergencies. The signs and symptoms of potential                            delayed complications were discussed with the                            patient. Return to normal activities tomorrow.                            Written discharge instructions were provided to the                            patient.                           - Resume previous diet.                           - Continue present medications.                           - Await pathology  results.                           - Return to hematology to monitor anemia.                           If patient develops recurrent iron  deficiency after                            the most recent iron  infusions, please perform  Hemoccult cards x 3. If positive, refer back to                            this office for small bowel video capsule study. Teressa Mcglocklin L. Legrand, MD 10/24/2024 8:21:37 AM This report has been signed electronically.

## 2024-10-24 NOTE — Progress Notes (Signed)
0802 Robinul 0.1 mg IV given due large amount of secretions upon assessment.  MD made aware, vss 

## 2024-10-24 NOTE — Progress Notes (Signed)
 No significant changes to clinical history since GI office visit on 10/25/24.  The patient is appropriate for an endoscopic procedure in the ambulatory setting.  - Victory Brand, MD

## 2024-10-24 NOTE — Progress Notes (Signed)
 Called to room to assist during endoscopic procedure.  Patient ID and intended procedure confirmed with present staff. Received instructions for my participation in the procedure from the performing physician.

## 2024-10-26 ENCOUNTER — Other Ambulatory Visit: Payer: Self-pay | Admitting: Family Medicine

## 2024-10-26 DIAGNOSIS — R519 Headache, unspecified: Secondary | ICD-10-CM

## 2024-10-29 ENCOUNTER — Telehealth: Payer: Self-pay

## 2024-10-29 NOTE — Telephone Encounter (Signed)
 Attempted to reach patient for follow up phone call. No answer, left voicemail to contact Dr. Clayburn office with any questions or concerns.

## 2024-10-31 LAB — SURGICAL PATHOLOGY

## 2024-11-07 ENCOUNTER — Ambulatory Visit: Payer: Self-pay | Admitting: Gastroenterology

## 2024-11-08 ENCOUNTER — Ambulatory Visit
Admission: RE | Admit: 2024-11-08 | Discharge: 2024-11-08 | Disposition: A | Discharge status: Home / Self Care | Source: Ambulatory Visit | Attending: Family Medicine | Admitting: Family Medicine

## 2024-11-08 DIAGNOSIS — R519 Headache, unspecified: Secondary | ICD-10-CM

## 2024-12-06 ENCOUNTER — Ambulatory Visit: Admitting: Dermatology

## 2025-01-01 ENCOUNTER — Inpatient Hospital Stay: Admitting: Physician Assistant

## 2025-01-01 ENCOUNTER — Inpatient Hospital Stay: Attending: Physician Assistant

## 2025-01-02 ENCOUNTER — Ambulatory Visit: Admitting: Allergy
